# Patient Record
Sex: Female | Born: 1960 | Race: White | Hispanic: No | Marital: Married | Smoking: Current every day smoker
Health system: Southern US, Community
[De-identification: ages and names within clinical notes are randomized; demographics above are authoritative.]

## PROBLEM LIST (undated history)

## (undated) DIAGNOSIS — F3289 Other specified depressive episodes: Secondary | ICD-10-CM

## (undated) DIAGNOSIS — K219 Gastro-esophageal reflux disease without esophagitis: Secondary | ICD-10-CM

## (undated) DIAGNOSIS — F17200 Nicotine dependence, unspecified, uncomplicated: Secondary | ICD-10-CM

## (undated) DIAGNOSIS — E7800 Pure hypercholesterolemia, unspecified: Secondary | ICD-10-CM

## (undated) DIAGNOSIS — K5730 Diverticulosis of large intestine without perforation or abscess without bleeding: Secondary | ICD-10-CM

## (undated) DIAGNOSIS — K648 Other hemorrhoids: Secondary | ICD-10-CM

## (undated) DIAGNOSIS — R209 Unspecified disturbances of skin sensation: Secondary | ICD-10-CM

## (undated) DIAGNOSIS — R569 Unspecified convulsions: Secondary | ICD-10-CM

## (undated) DIAGNOSIS — R7309 Other abnormal glucose: Secondary | ICD-10-CM

## (undated) DIAGNOSIS — IMO0002 Reserved for concepts with insufficient information to code with codable children: Secondary | ICD-10-CM

## (undated) DIAGNOSIS — N319 Neuromuscular dysfunction of bladder, unspecified: Secondary | ICD-10-CM

## (undated) DIAGNOSIS — M797 Fibromyalgia: Secondary | ICD-10-CM

## (undated) DIAGNOSIS — F419 Anxiety disorder, unspecified: Secondary | ICD-10-CM

## (undated) DIAGNOSIS — F1021 Alcohol dependence, in remission: Secondary | ICD-10-CM

## (undated) DIAGNOSIS — J309 Allergic rhinitis, unspecified: Secondary | ICD-10-CM

## (undated) DIAGNOSIS — F329 Major depressive disorder, single episode, unspecified: Secondary | ICD-10-CM

## (undated) DIAGNOSIS — I872 Venous insufficiency (chronic) (peripheral): Secondary | ICD-10-CM

## (undated) DIAGNOSIS — IMO0001 Reserved for inherently not codable concepts without codable children: Secondary | ICD-10-CM

## (undated) DIAGNOSIS — M25519 Pain in unspecified shoulder: Secondary | ICD-10-CM

## (undated) HISTORY — DX: Neuromuscular dysfunction of bladder, unspecified: N31.9

## (undated) HISTORY — DX: Other specified depressive episodes: F32.89

## (undated) HISTORY — DX: Gastro-esophageal reflux disease without esophagitis: K21.9

## (undated) HISTORY — DX: Reserved for inherently not codable concepts without codable children: IMO0001

## (undated) HISTORY — DX: Fibromyalgia: M79.7

## (undated) HISTORY — DX: Other hemorrhoids: K64.8

## (undated) HISTORY — DX: Reserved for concepts with insufficient information to code with codable children: IMO0002

## (undated) HISTORY — DX: Diverticulosis of large intestine without perforation or abscess without bleeding: K57.30

## (undated) HISTORY — DX: Pain in unspecified shoulder: M25.519

## (undated) HISTORY — DX: Pure hypercholesterolemia, unspecified: E78.00

## (undated) HISTORY — DX: Alcohol dependence, in remission: F10.21

## (undated) HISTORY — PX: BREAST ENHANCEMENT SURGERY: SHX7

## (undated) HISTORY — DX: Major depressive disorder, single episode, unspecified: F32.9

## (undated) HISTORY — PX: TEMPOROMANDIBULAR JOINT SURGERY: SHX35

## (undated) HISTORY — DX: Nicotine dependence, unspecified, uncomplicated: F17.200

## (undated) HISTORY — PX: INTERSTIM IMPLANT PLACEMENT: SHX5130

## (undated) HISTORY — DX: Other abnormal glucose: R73.09

## (undated) HISTORY — DX: Allergic rhinitis, unspecified: J30.9

## (undated) HISTORY — DX: Unspecified disturbances of skin sensation: R20.9

## (undated) HISTORY — DX: Unspecified convulsions: R56.9

## (undated) HISTORY — DX: Venous insufficiency (chronic) (peripheral): I87.2

## (undated) HISTORY — DX: Anxiety disorder, unspecified: F41.9

---

## 1994-01-08 HISTORY — PX: VAGINAL HYSTERECTOMY: SUR661

## 2005-04-26 ENCOUNTER — Emergency Department (HOSPITAL_COMMUNITY): Admission: EM | Admit: 2005-04-26 | Discharge: 2005-04-27 | Payer: Self-pay | Admitting: Emergency Medicine

## 2005-05-10 ENCOUNTER — Ambulatory Visit: Payer: Self-pay | Admitting: Unknown Physician Specialty

## 2005-06-08 ENCOUNTER — Ambulatory Visit: Payer: Self-pay | Admitting: Unknown Physician Specialty

## 2006-06-29 ENCOUNTER — Emergency Department: Payer: Self-pay | Admitting: Emergency Medicine

## 2006-10-07 ENCOUNTER — Other Ambulatory Visit: Payer: Self-pay

## 2006-10-07 ENCOUNTER — Inpatient Hospital Stay: Payer: Self-pay | Admitting: Unknown Physician Specialty

## 2006-12-23 ENCOUNTER — Ambulatory Visit: Payer: Self-pay | Admitting: Internal Medicine

## 2007-02-06 ENCOUNTER — Emergency Department: Payer: Self-pay | Admitting: Emergency Medicine

## 2007-07-31 ENCOUNTER — Ambulatory Visit: Payer: Self-pay | Admitting: Internal Medicine

## 2007-08-20 ENCOUNTER — Ambulatory Visit: Payer: Self-pay | Admitting: Orthopedic Surgery

## 2007-08-27 ENCOUNTER — Ambulatory Visit: Payer: Self-pay | Admitting: Orthopedic Surgery

## 2007-11-10 ENCOUNTER — Emergency Department: Payer: Self-pay | Admitting: Emergency Medicine

## 2007-12-03 ENCOUNTER — Ambulatory Visit: Payer: Self-pay | Admitting: Family Medicine

## 2008-01-25 ENCOUNTER — Emergency Department: Payer: Self-pay | Admitting: Emergency Medicine

## 2008-05-14 ENCOUNTER — Emergency Department: Payer: Self-pay | Admitting: Emergency Medicine

## 2008-09-30 ENCOUNTER — Ambulatory Visit: Payer: Self-pay | Admitting: Family Medicine

## 2008-10-20 ENCOUNTER — Ambulatory Visit: Payer: Self-pay | Admitting: Family Medicine

## 2008-10-27 ENCOUNTER — Ambulatory Visit: Payer: Self-pay | Admitting: Otolaryngology

## 2008-10-27 LAB — HM COLONOSCOPY

## 2008-11-04 ENCOUNTER — Ambulatory Visit: Payer: Self-pay | Admitting: Gastroenterology

## 2008-11-04 HISTORY — PX: COLONOSCOPY: SHX174

## 2008-12-09 ENCOUNTER — Emergency Department: Payer: Self-pay | Admitting: Emergency Medicine

## 2008-12-13 ENCOUNTER — Ambulatory Visit: Payer: Self-pay

## 2009-08-03 ENCOUNTER — Ambulatory Visit: Payer: Self-pay | Admitting: Family Medicine

## 2009-08-05 ENCOUNTER — Ambulatory Visit: Payer: Self-pay | Admitting: Family Medicine

## 2009-08-26 ENCOUNTER — Emergency Department (HOSPITAL_COMMUNITY): Admission: EM | Admit: 2009-08-26 | Discharge: 2009-08-26 | Payer: Self-pay | Admitting: Emergency Medicine

## 2009-10-04 ENCOUNTER — Ambulatory Visit: Payer: Self-pay | Admitting: Family Medicine

## 2009-10-31 ENCOUNTER — Ambulatory Visit: Payer: Self-pay | Admitting: Family Medicine

## 2009-11-03 ENCOUNTER — Ambulatory Visit: Payer: Self-pay | Admitting: Family Medicine

## 2009-11-04 ENCOUNTER — Ambulatory Visit: Payer: Self-pay | Admitting: Family Medicine

## 2009-11-09 ENCOUNTER — Ambulatory Visit: Payer: Self-pay | Admitting: Family Medicine

## 2010-03-08 ENCOUNTER — Emergency Department: Payer: Self-pay | Admitting: Internal Medicine

## 2010-03-24 LAB — URINALYSIS, ROUTINE W REFLEX MICROSCOPIC
Hgb urine dipstick: NEGATIVE
Nitrite: NEGATIVE
Protein, ur: NEGATIVE mg/dL
Specific Gravity, Urine: 1.012 (ref 1.005–1.030)
Urobilinogen, UA: 0.2 mg/dL (ref 0.0–1.0)

## 2010-03-24 LAB — URINE CULTURE: Colony Count: 30000

## 2010-03-24 LAB — URINE MICROSCOPIC-ADD ON

## 2010-05-31 ENCOUNTER — Ambulatory Visit: Payer: Self-pay | Admitting: Family Medicine

## 2010-07-08 ENCOUNTER — Emergency Department: Payer: Self-pay | Admitting: Emergency Medicine

## 2010-07-28 ENCOUNTER — Ambulatory Visit: Payer: Self-pay | Admitting: Orthopedic Surgery

## 2010-09-13 ENCOUNTER — Ambulatory Visit: Payer: Self-pay | Admitting: Family Medicine

## 2010-11-03 ENCOUNTER — Ambulatory Visit: Payer: Self-pay | Admitting: Family Medicine

## 2010-12-12 ENCOUNTER — Ambulatory Visit: Payer: Self-pay | Admitting: Family Medicine

## 2011-01-14 LAB — BASIC METABOLIC PANEL
Anion Gap: 6 — ABNORMAL LOW (ref 7–16)
BUN: 6 mg/dL — ABNORMAL LOW (ref 7–18)
Calcium, Total: 9.1 mg/dL (ref 8.5–10.1)
Chloride: 105 mmol/L (ref 98–107)
EGFR (African American): >60
EGFR (Non-African Amer.): >60
Glucose: 92 mg/dL (ref 65–99)
Osmolality: 279 (ref 275–301)
Potassium: 3.6 mmol/L (ref 3.5–5.1)
Sodium: 141 mmol/L (ref 136–145)

## 2011-01-14 LAB — CK TOTAL AND CKMB (NOT AT ARMC): CK-MB: <0.5 ng/mL — ABNORMAL LOW (ref 0.5–3.6)

## 2011-01-14 LAB — CBC
HCT: 37.3 % (ref 35.0–47.0)
MCHC: 33.4 g/dL (ref 32.0–36.0)
RDW: 13 % (ref 11.5–14.5)
WBC: 6.5 10*3/uL (ref 3.6–11.0)

## 2011-01-14 LAB — HEPATIC FUNCTION PANEL A (ARMC)
Albumin: 4 g/dL (ref 3.4–5.0)
Bilirubin, Direct: <0.1 mg/dL (ref 0.00–0.20)
SGOT(AST): 29 U/L (ref 15–37)
SGPT (ALT): 30 U/L
Total Protein: 7 g/dL (ref 6.4–8.2)

## 2011-01-15 ENCOUNTER — Observation Stay: Payer: Self-pay | Admitting: Internal Medicine

## 2011-01-15 LAB — PROTIME-INR: INR: 1

## 2011-01-15 LAB — LIPID PANEL
Cholesterol: 145 mg/dL (ref 0–200)
Ldl Cholesterol, Calc: 68 mg/dL (ref 0–100)

## 2011-01-15 LAB — HEMOGLOBIN A1C: Hemoglobin A1C: 6 % (ref 4.2–6.3)

## 2011-01-15 LAB — CBC WITH DIFFERENTIAL/PLATELET
Basophil #: 0 10*3/uL (ref 0.0–0.1)
Basophil %: 0.2 %
Eosinophil #: 0.1 10*3/uL (ref 0.0–0.7)
HCT: 37.1 % (ref 35.0–47.0)
MCH: 31 pg (ref 26.0–34.0)
MCV: 91 fL (ref 80–100)
Monocyte #: 0.4 10*3/uL (ref 0.0–0.7)
Neutrophil #: 2.5 10*3/uL (ref 1.4–6.5)
Platelet: 208 10*3/uL (ref 150–440)
RDW: 13 % (ref 11.5–14.5)
WBC: 6.2 10*3/uL (ref 3.6–11.0)

## 2011-01-15 LAB — CK TOTAL AND CKMB (NOT AT ARMC)
CK, Total: 71 U/L (ref 21–215)
CK, Total: 68 U/L (ref 21–215)
CK-MB: <0.5 ng/mL — ABNORMAL LOW (ref 0.5–3.6)
CK-MB: 0.5 ng/mL (ref 0.5–3.6)

## 2011-01-15 LAB — BASIC METABOLIC PANEL
Anion Gap: 8 (ref 7–16)
BUN: 6 mg/dL — ABNORMAL LOW (ref 7–18)
EGFR (African American): >60
EGFR (Non-African Amer.): >60
Glucose: 95 mg/dL (ref 65–99)
Osmolality: 286 (ref 275–301)

## 2011-01-15 LAB — TSH: Thyroid Stimulating Horm: 2.11 u[IU]/mL

## 2011-01-15 LAB — TROPONIN I: Troponin-I: <0.02 ng/mL

## 2011-02-01 ENCOUNTER — Ambulatory Visit: Payer: Self-pay | Admitting: Family Medicine

## 2011-02-23 ENCOUNTER — Ambulatory Visit: Payer: Self-pay | Admitting: Family Medicine

## 2011-02-25 IMAGING — CT CT NECK-CHEST W/ CM
2 of 3 series · 7 of 14 positions shown, 8 images · IV contrast (APPLIED)
Comparison: none

REASON FOR EXAM: chest pain  SOB nausea palpitations dysphagia LED  CHEST
w PE protocol
COMMENTS:

PROCEDURE:     NIVIRUS - NIVIRUS NECK AND CHEST W  - September 30, 2008  [DATE]
RESULT:     CT neck and chest dated 09/30/2008
TECHNIQUE: Helical 3 mm sections were obtained from the skull base through
the thoracic inlet status post intravenous administration of 100 mL Isovue
370. Helical 3 mm sections were also obtained from the thoracic inlet
through the lung bases status post administration of 100 mL of Dsovue-B84
for a pulmonary embolus protocol. The initial injection demonstrated
suboptimal opacification of the pulmonary arterial system. A repeat
injection was performed status post intravenous administration of 75 mL
Isovue 370. The second injection also demonstrated suboptimal opacification
of the pulmonary vessels. If there is persistent clinical concern a repeat
PE protocol chest CT can't be performed.

[Series 7: soft tissue · axial · 0.78mm/px · z∈[+389,+686]mm · 5 of 146 slices shown]
[im 25/146  soft-tissue]
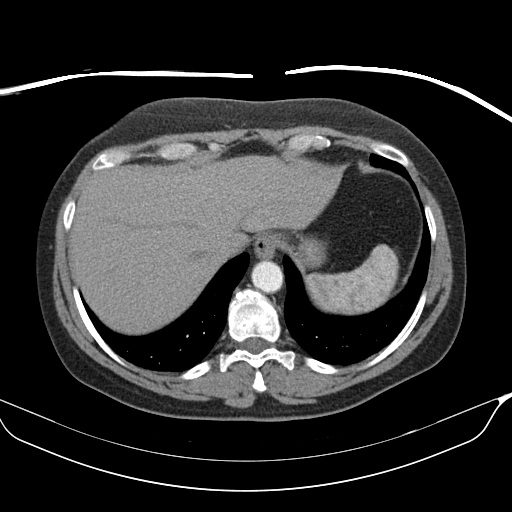
[im 49/146  soft-tissue]
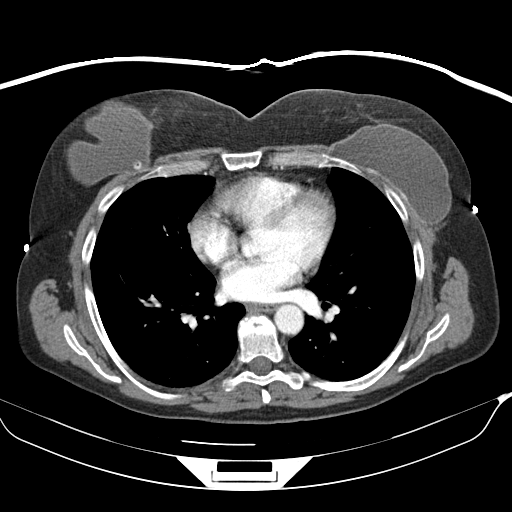
[im 73/146  soft-tissue]
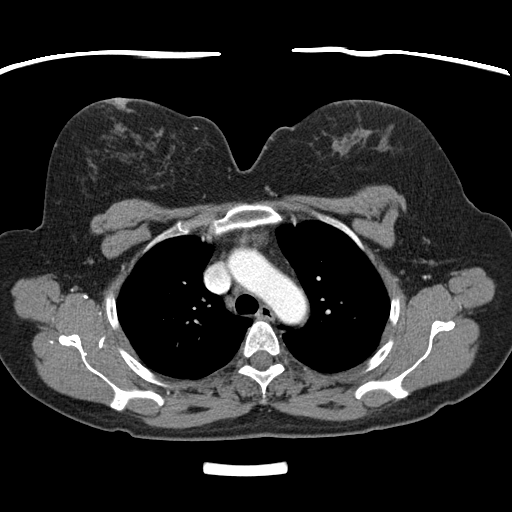
[im 97/146  soft-tissue]
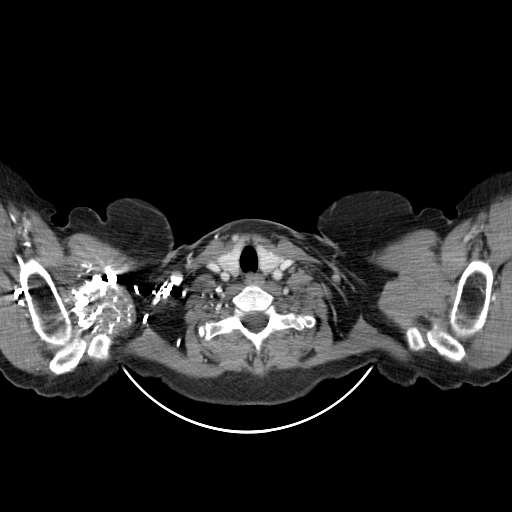
[im 121/146  soft-tissue]
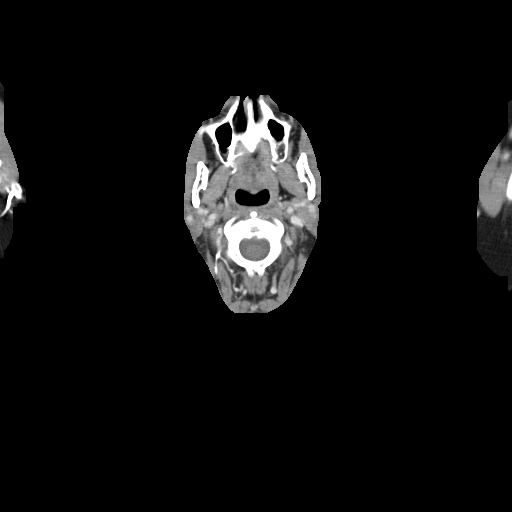

[Series 14: pulmonary repeat · axial · 0.78mm/px · z∈[+415,+484]mm · 2 of 70 slices shown, 3 images]
[im 24/70  soft-tissue]
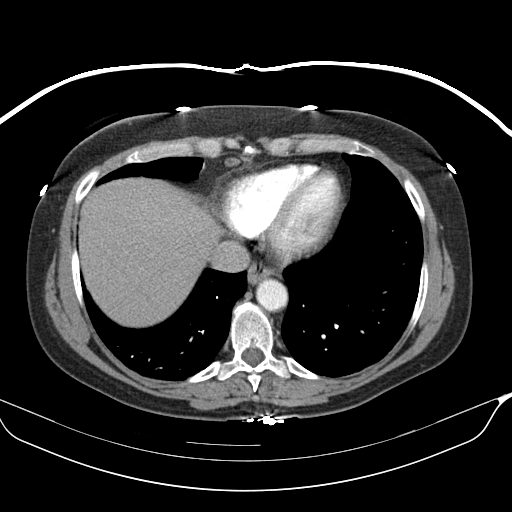
[im 24/70  bone]
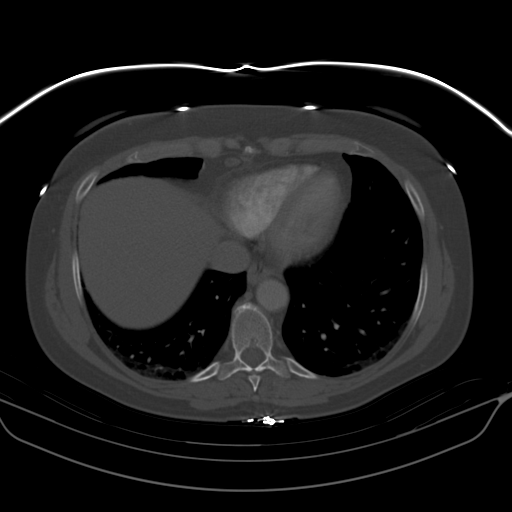
[im 47/70  bone]
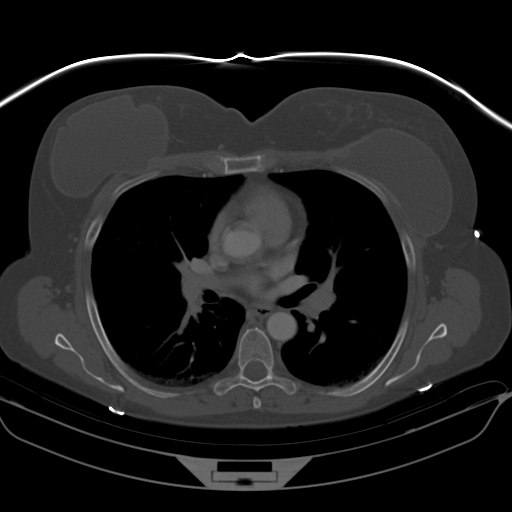

[7 of 14 positions shown; findings below may reference images not displayed]

FINDINGS: Neck: Evaluation of the neck demonstrates no evidence of masses free fluid
nor drainable loculated fluid collections. The opacified vascular structures
are unremarkable. Prominent lymph nodes are identified in the anterior and
posterior cervical chains. The largest lymph nodes project within the pre-
carotid space the largest is on the right on image #35 in the precarinal
space measuring 1.51 cm in AP dimensions. A 1.13 cm lymph node in the
carotid space on the left is identified. The remaining lymph nodes appear to
be subcentimeter in size. The patient's airway is patent. There is no CT
evidence of muscle infiltration, asymmetry or abnormal enhancement.

Chest: Evaluation of the mediastinum and hilar regions and structures
demonstrate no evidence of mediastinal nor hilar adenopathy nor masses. As
stated above there is suboptimal opacification of the pulmonary arterial
tree. There is no gross evidence of large central pulmonary embolic disease.
The lobar segmental and subsegmental arteries are suboptimally pulmonary
arteries are suboptimally opacified opacified. No gross pulmonary arterial
disease is identified within the lobar pulmonary arterial vessels.
Evaluation of the lung parenchyma demonstrate no evidence of focal
infiltrates effusions or edema. This hypoventilation within the lung bases.
The visualized upper abdominal viscera demonstrate no gross abnormalities.
IMPRESSION: 1. Prominent lymph nodes within the neck as described above further
abnormalities.
2. No evidence of pulmonary infiltrates, effusions, edema, masses, nor
nodules. There is no evidence of mediastinal masses or adenopathy.
Evaluation of the pulmonary arterial system is suboptimal as described above
and no gross evidence of large pulmonary emboli is identified.

## 2011-05-01 LAB — HM PAP SMEAR: HM Pap smear: NORMAL

## 2011-06-07 ENCOUNTER — Emergency Department: Payer: Self-pay | Admitting: Emergency Medicine

## 2011-06-26 ENCOUNTER — Ambulatory Visit: Payer: Self-pay | Admitting: Family Medicine

## 2011-07-16 DIAGNOSIS — M7502 Adhesive capsulitis of left shoulder: Secondary | ICD-10-CM | POA: Insufficient documentation

## 2011-07-27 DIAGNOSIS — M25519 Pain in unspecified shoulder: Secondary | ICD-10-CM | POA: Insufficient documentation

## 2011-10-02 ENCOUNTER — Encounter: Payer: Self-pay | Admitting: Family Medicine

## 2011-10-04 ENCOUNTER — Encounter: Payer: Self-pay | Admitting: *Deleted

## 2011-10-29 ENCOUNTER — Ambulatory Visit (INDEPENDENT_AMBULATORY_CARE_PROVIDER_SITE_OTHER): Payer: Managed Care, Other (non HMO) | Admitting: Family Medicine

## 2011-10-29 ENCOUNTER — Encounter: Payer: Self-pay | Admitting: Family Medicine

## 2011-10-29 VITALS — BP 127/81 | HR 74 | Temp 98.2°F | Resp 16 | Ht 67.0 in | Wt 187.0 lb

## 2011-10-29 DIAGNOSIS — E7800 Pure hypercholesterolemia, unspecified: Secondary | ICD-10-CM

## 2011-10-29 DIAGNOSIS — R7309 Other abnormal glucose: Secondary | ICD-10-CM

## 2011-10-29 DIAGNOSIS — M6281 Muscle weakness (generalized): Secondary | ICD-10-CM

## 2011-10-29 DIAGNOSIS — F418 Other specified anxiety disorders: Secondary | ICD-10-CM

## 2011-10-29 DIAGNOSIS — F329 Major depressive disorder, single episode, unspecified: Secondary | ICD-10-CM

## 2011-10-29 DIAGNOSIS — J309 Allergic rhinitis, unspecified: Secondary | ICD-10-CM

## 2011-10-29 DIAGNOSIS — R339 Retention of urine, unspecified: Secondary | ICD-10-CM | POA: Insufficient documentation

## 2011-10-29 MED ORDER — ESCITALOPRAM OXALATE 10 MG PO TABS: 10.0000 mg | ORAL_TABLET | Freq: Every day | ORAL | Status: DC

## 2011-10-29 MED ORDER — FLUTICASONE PROPIONATE 50 MCG/ACT NA SUSP: 2.0000 | Freq: Every day | NASAL | Status: DC

## 2011-10-29 NOTE — Assessment & Plan Note (Signed)
Improved s/p bladder stimulator; no longer warranting self catheterization.

## 2011-10-29 NOTE — Assessment & Plan Note (Signed)
Stable; weight loss since last visit; obtain labs.  Continue with weight loss, dietary modification.
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
## 2011-10-29 NOTE — Assessment & Plan Note (Signed)
Worsening/uncontrolled. Wean Zoloft over next month; start Lexapro 10mg  daily.  Counseling provided during visit; close follow-up in two months and sooner if worsens.

## 2011-10-29 NOTE — Assessment & Plan Note (Signed)
Uncontrolled; rx for Flonase provided; to continue Allegra daily.

## 2011-10-29 NOTE — Assessment & Plan Note (Signed)
Stable; followed every six months by Dr. Sandria Manly.

## 2011-10-29 NOTE — Assessment & Plan Note (Signed)
Controlled; no change in management; obtain labs. 

## 2011-10-29 NOTE — Patient Instructions (Addendum)
1. Depression with anxiety   2. Allergic rhinitis, cause unspecified   3. Pure hypercholesterolemia   4. Other abnormal glucose   5. Muscle weakness      1.  TAKE 1.5 ZOLOFT DAILY FOR ONE WEEK. 2. TAKE 1 TABLET ZOLOFT DAILY WITH 1 TABLET LEXAPRO DAILY FOR TWO WEEKS. 3.  TAKE 1/2 TABLET ZOLOFT DAILY WITH 1 TABLET LEXAPRO FOR ONE WEEK 4.  STOP ZOLOFT; CONTINUE LEXAPRO 1 TABLET DAILY

## 2011-10-30 LAB — CBC WITH DIFFERENTIAL/PLATELET
Basophils Absolute: 0 10*3/uL (ref 0.0–0.1)
Basophils Relative: 0 % (ref 0–1)
Eosinophils Absolute: 0.1 10*3/uL (ref 0.0–0.7)
HCT: 40.6 % (ref 36.0–46.0)
Hemoglobin: 14 g/dL (ref 12.0–15.0)
MCH: 31 pg (ref 26.0–34.0)
MCHC: 34.5 g/dL (ref 30.0–36.0)
Monocytes Absolute: 0.7 10*3/uL (ref 0.1–1.0)
Monocytes Relative: 7 % (ref 3–12)
Neutro Abs: 6.5 10*3/uL (ref 1.7–7.7)
RDW: 13.6 % (ref 11.5–15.5)

## 2011-10-30 LAB — LIPID PANEL
Cholesterol: 179 mg/dL (ref 0–200)
HDL: 49 mg/dL (ref >39)
Total CHOL/HDL Ratio: 3.7 Ratio
Triglycerides: 154 mg/dL — ABNORMAL HIGH (ref <150)

## 2011-10-30 LAB — COMPREHENSIVE METABOLIC PANEL
AST: 23 U/L (ref 0–37)
Albumin: 4.8 g/dL (ref 3.5–5.2)
BUN: 10 mg/dL (ref 6–23)
CO2: 32 mEq/L (ref 19–32)
Calcium: 9.9 mg/dL (ref 8.4–10.5)
Chloride: 104 mEq/L (ref 96–112)
Creat: 0.77 mg/dL (ref 0.50–1.10)
Glucose, Bld: 72 mg/dL (ref 70–99)
Potassium: 4.4 mEq/L (ref 3.5–5.3)

## 2011-11-03 NOTE — Progress Notes (Signed)
Reviewed and agree.

## 2011-11-14 ENCOUNTER — Encounter: Payer: Self-pay | Admitting: Family Medicine

## 2011-11-21 ENCOUNTER — Other Ambulatory Visit: Payer: Self-pay | Admitting: Family Medicine

## 2011-11-22 ENCOUNTER — Telehealth: Payer: Self-pay

## 2011-11-22 NOTE — Telephone Encounter (Signed)
Patient states that the pharmacy sent in a refill request for her Ambien.  Patient is wanting to get a refill sent in for [facility] to [facility] [place], [place].   Best#: [phone]  Pharmacy: [facility] [place], [place]

## 2011-11-22 NOTE — Telephone Encounter (Signed)
Called patient advised this is sent in for her.

## 2011-12-20 ENCOUNTER — Other Ambulatory Visit: Payer: Self-pay | Admitting: Physician Assistant

## 2011-12-21 ENCOUNTER — Telehealth: Payer: Self-pay

## 2011-12-21 NOTE — Telephone Encounter (Signed)
OK to call in refill of Ambien CR 12.5mg  one po qhs PRN #[phone] refills. KMS

## 2011-12-21 NOTE — Telephone Encounter (Signed)
The patient called to request refill for her Ambien rx.  The patient stated that she called the pharmacy for refill and there were no remaining refills, which is not typical for her rx.  The patient stated that Dr. Katrinka Blazing usually writes the rx with refills.  Please call the patient at [phone].  [facility] [place] [facility].

## 2011-12-21 NOTE — Telephone Encounter (Signed)
lmom that rx called in and placed on hold.

## 2011-12-21 NOTE — Telephone Encounter (Signed)
Please advise, patient has been taking 12.5mg  but new recommendations are for female patients to take 6.25 mg dose. Patient is asking for refills.

## 2011-12-29 IMAGING — US ABDOMEN ULTRASOUND LIMITED
1 series · 17 of 25 positions shown · non-contrast
Comparison: none

REASON FOR EXAM: RUQ Abd Pain
COMMENTS:

[Series 1: abdomen ultrasound limited · 17 of 63 slices shown]
[im 1/63]
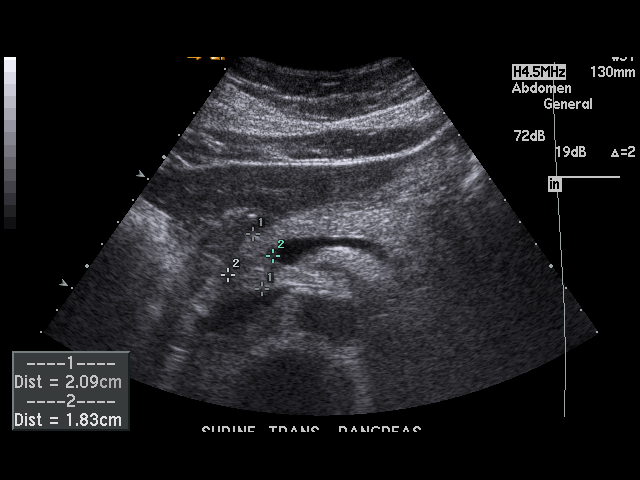
[im 6/63]
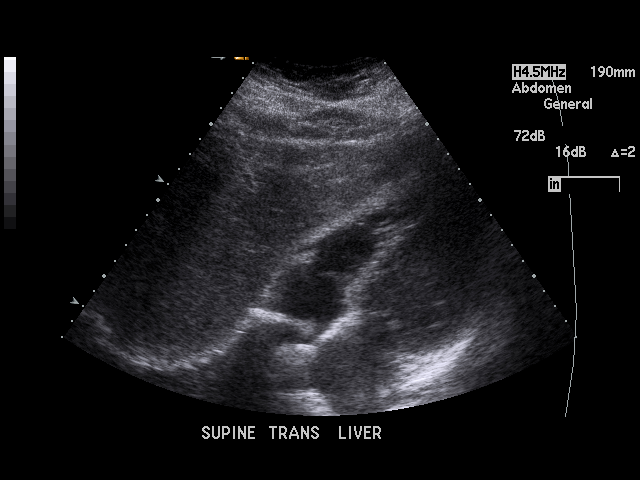
[im 8/63]
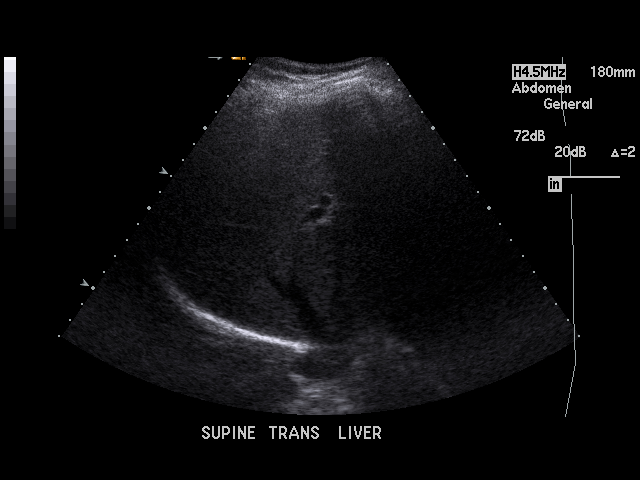
[im 13/63]
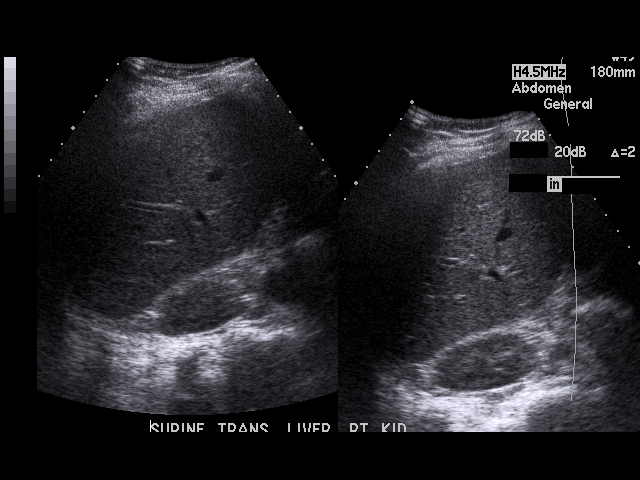
[im 16/63]
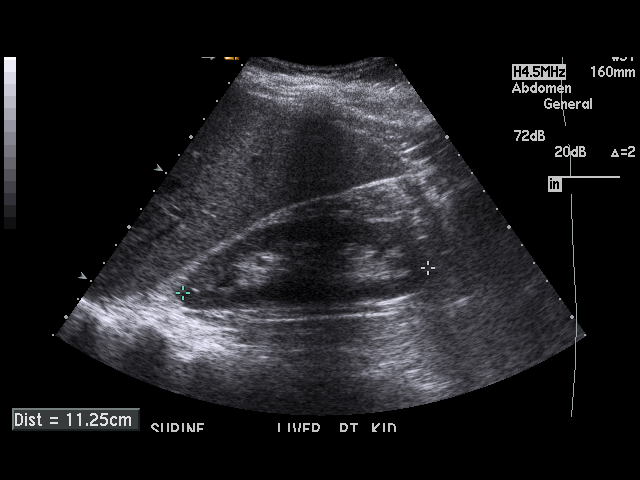
[im 21/63]
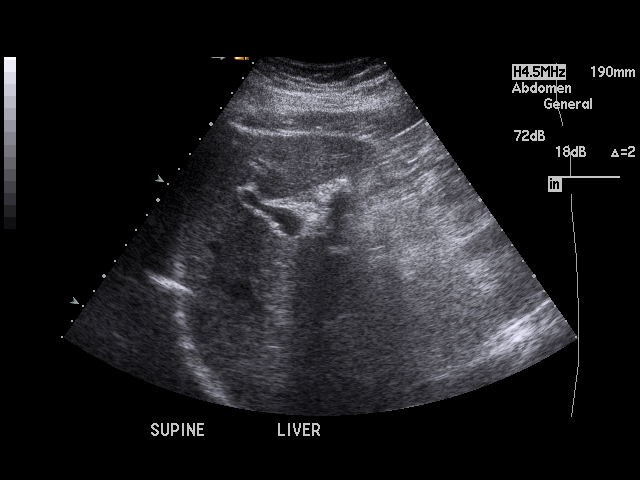
[im 24/63]
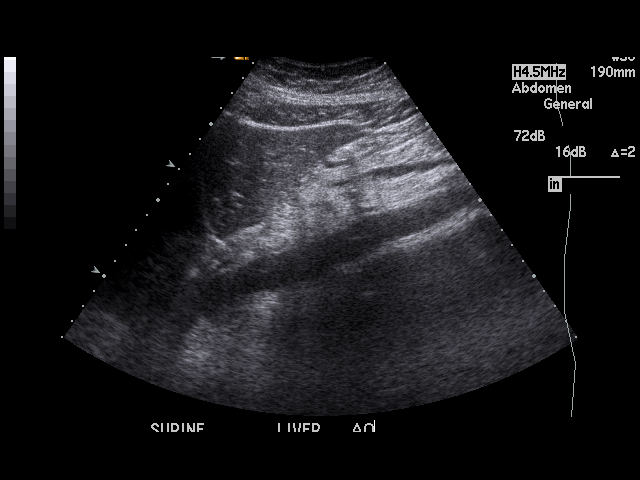
[im 29/63]
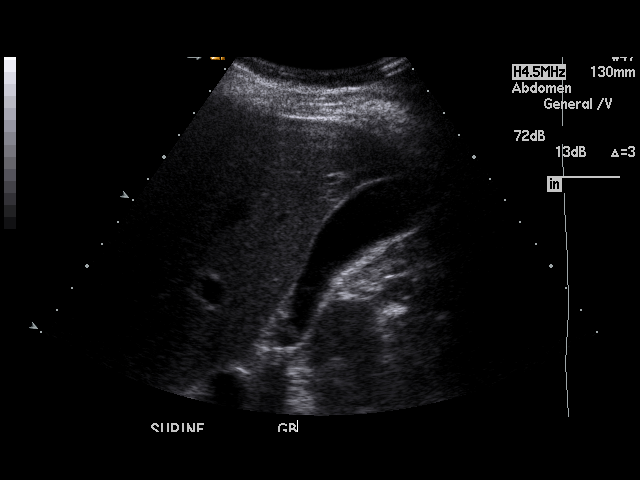
[im 32/63]
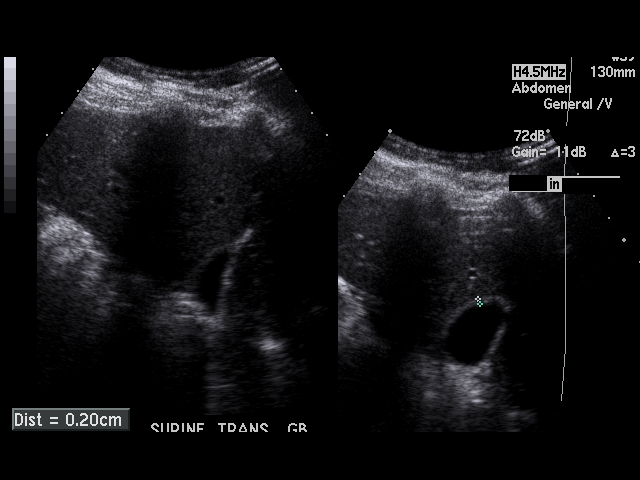
[im 34/63]
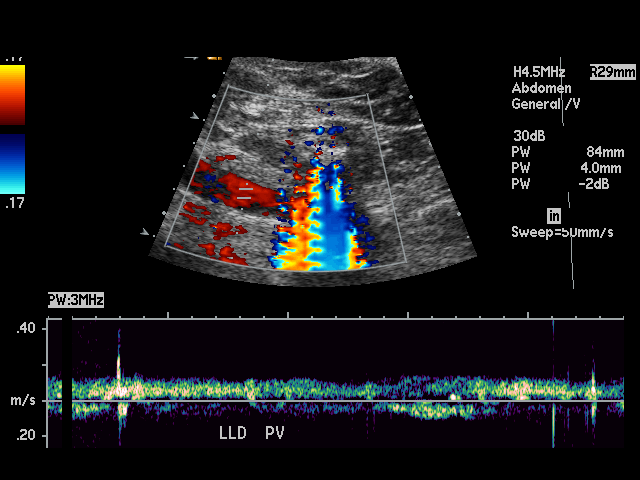
[im 39/63]
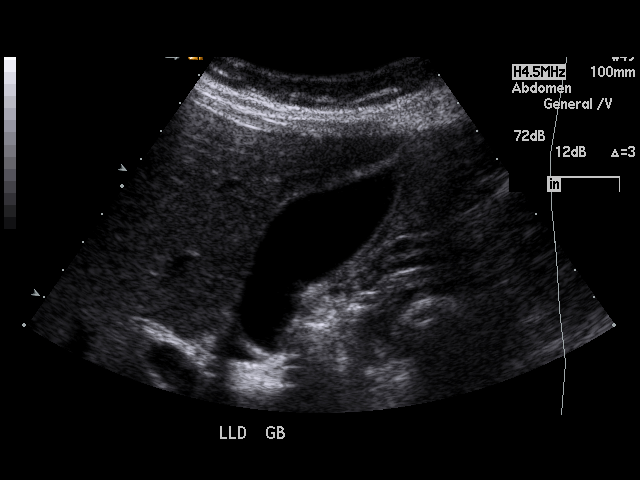
[im 42/63]
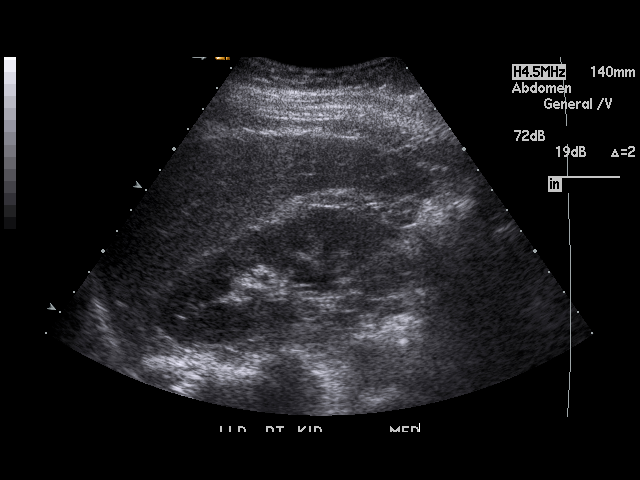
[im 47/63]
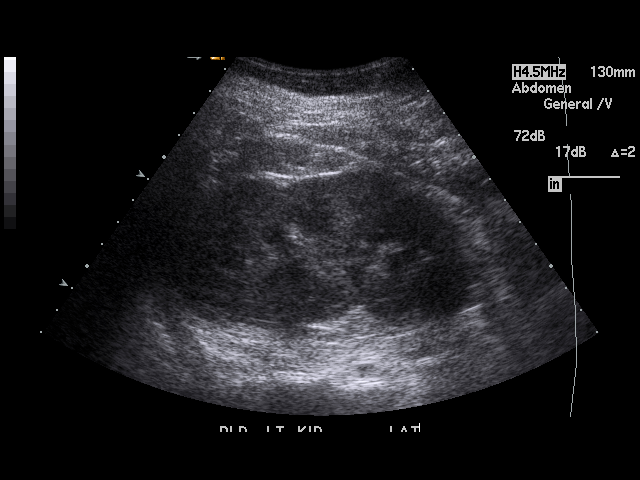
[im 50/63]
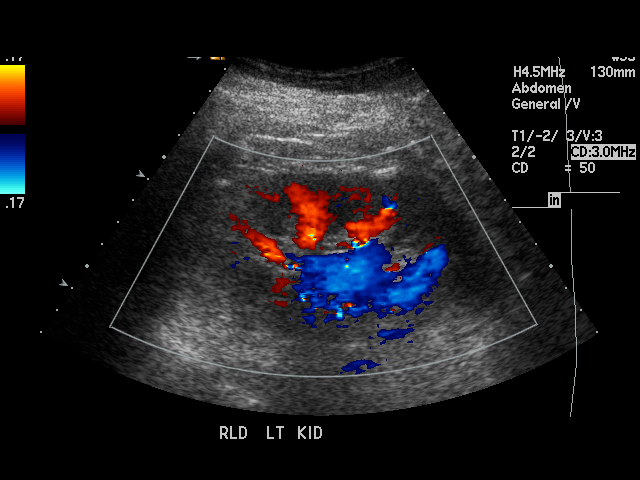
[im 55/63]
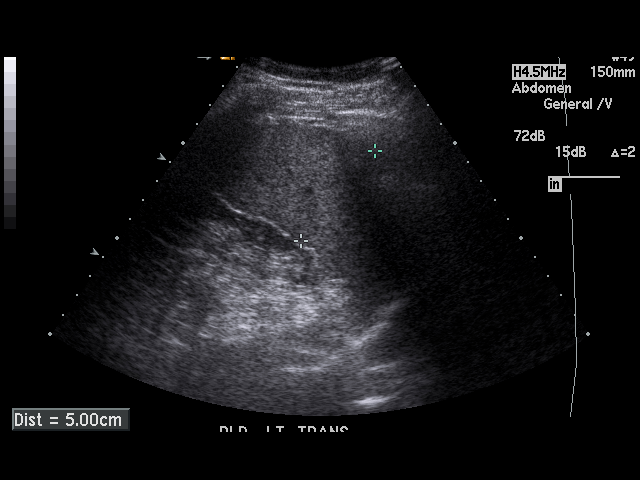
[im 57/63]
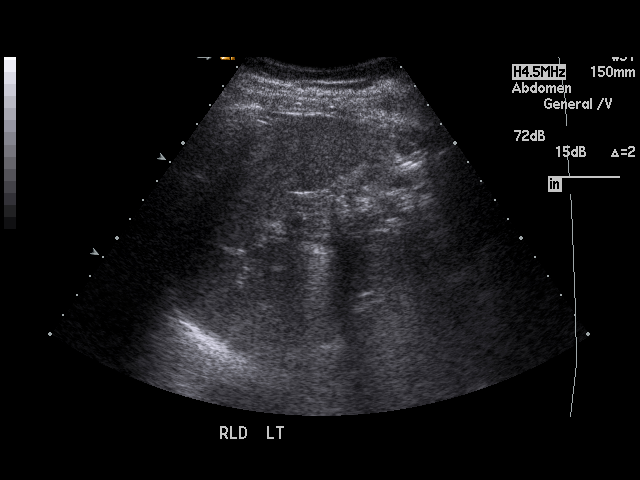
[im 63/63]
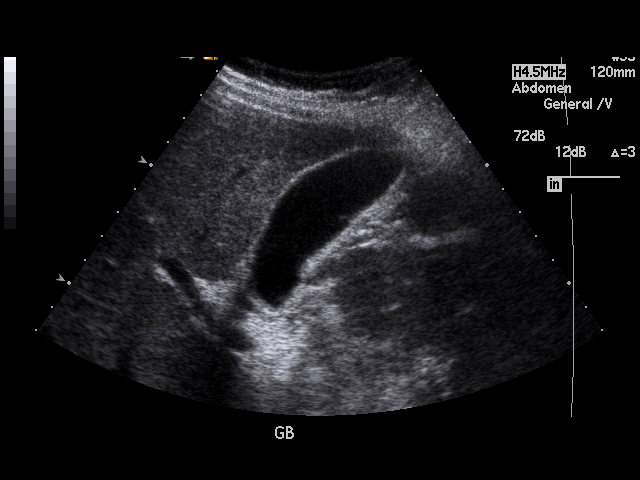

[17 of 25 positions shown; findings below may reference images not displayed]

PROCEDURE:     NEICHEV - NEICHEV ABDOMEN UPPER GENERAL  - August 03, 2009  [DATE]

RESULT:     Ultrasound of the abdomen is performed in the standard fashion.
Images of the pancreas show no gross abnormality. The tail is poorly seen.
The hepatic echotexture is normal. There is no focal hepatic mass or ductal
dilation. The right kidney is normal. The aorta and inferior vena cava
appear unremarkable. No gallstones are evident. The gallbladder wall
thickness is 2.0 mm. The portal venous flow is normal. The common bile duct
diameter is measured at 3.0 to 3.9 mm. The left kidney and spleen appear
normal.
IMPRESSION: Unremarkable abdominal sonogram.

## 2011-12-31 IMAGING — CT CT ABDOMEN W/ CM
1 of 2 series · 15 of 32 positions shown, 19 images · non-contrast
Comparison: none

REASON FOR EXAM: RUQ Abd Pain
COMMENTS:

PROCEDURE:     CT  - CT ABDOMEN STANDARD W  - August 05, 2009  [DATE]
RESULT:     Comparison is made to a prior study dated 11/11/2007.
TECHNIQUE: Helical 5 mm sections were obtained from the lung bases through
the superior iliac crest status post intravenous administration of 85 ml of
3sovue-A0B and oral contrast.

[Series 2: soft tissue · axial · 0.74mm/px · z∈[+259,+549]mm · 15 of 64 slices shown, 19 images]
[im 3/64  soft-tissue]
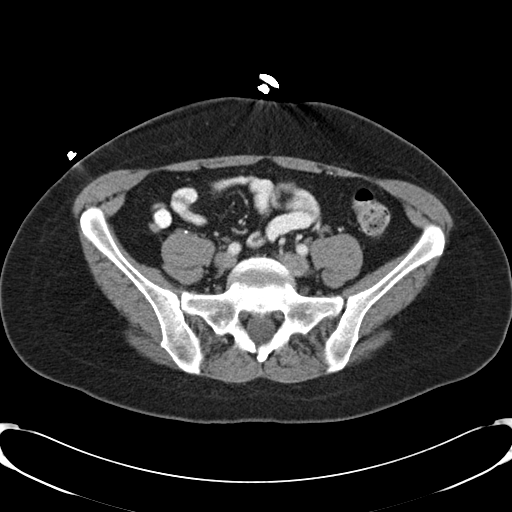
[im 3/64  bone]
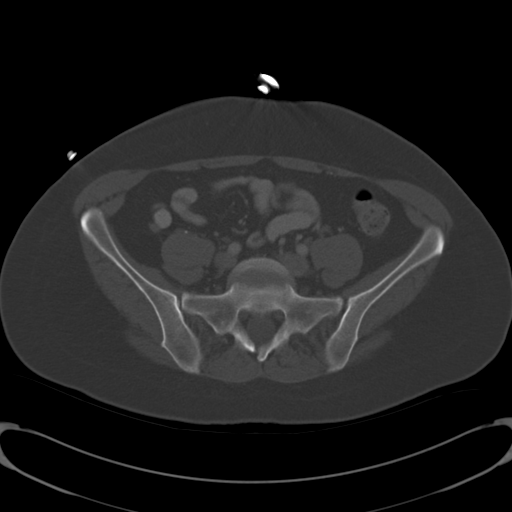
[im 9/64  soft-tissue]
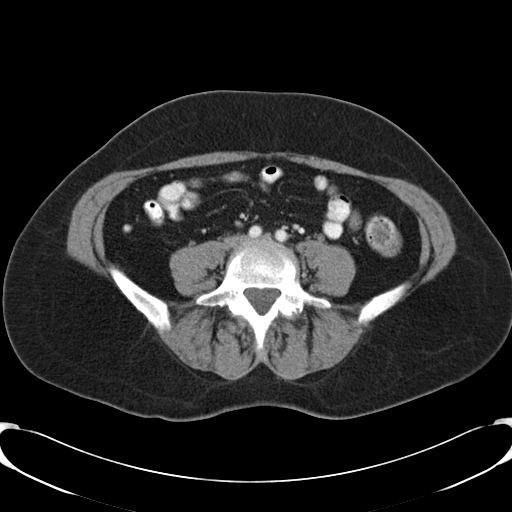
[im 14/64  soft-tissue]
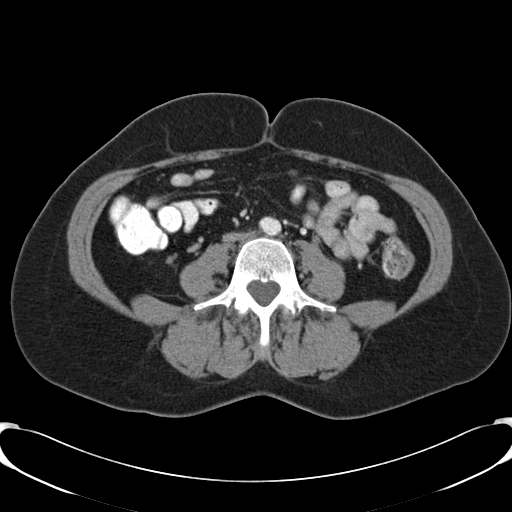
[im 17/64  soft-tissue]
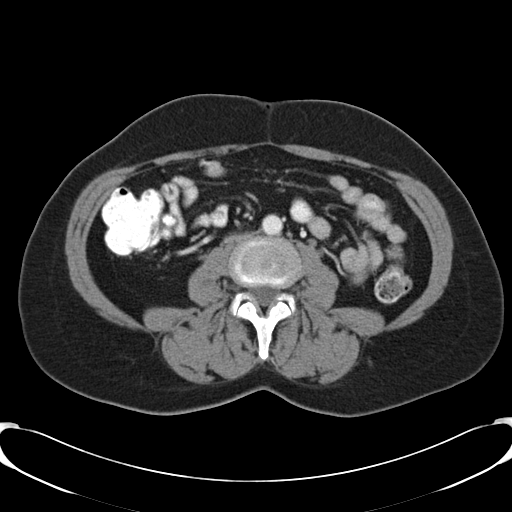
[im 22/64  soft-tissue]
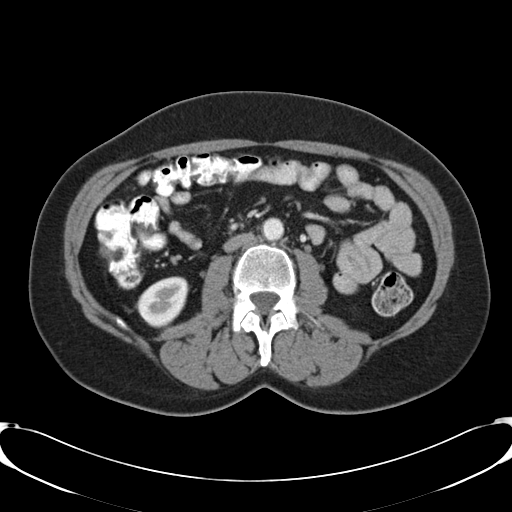
[im 28/64  soft-tissue]
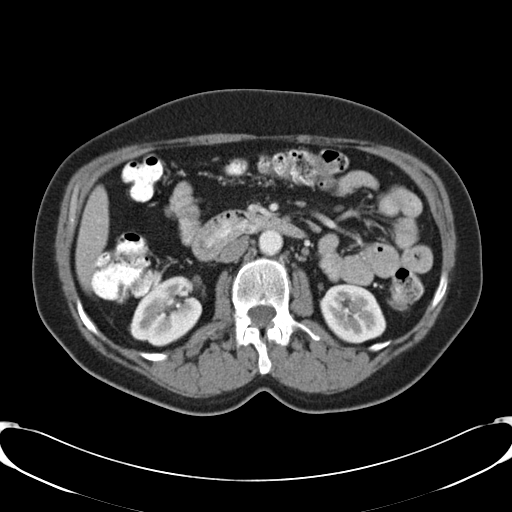
[im 33/64  soft-tissue]
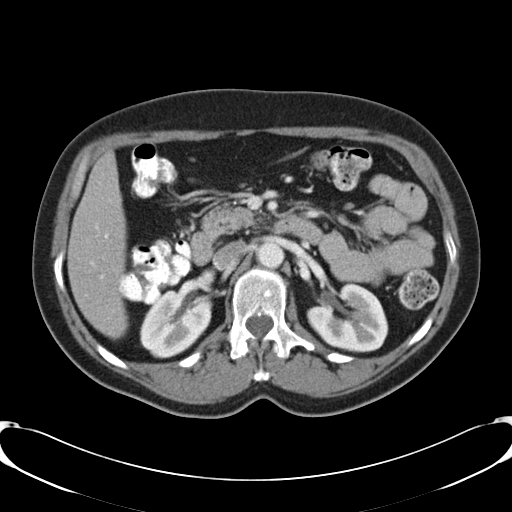
[im 36/64  soft-tissue]
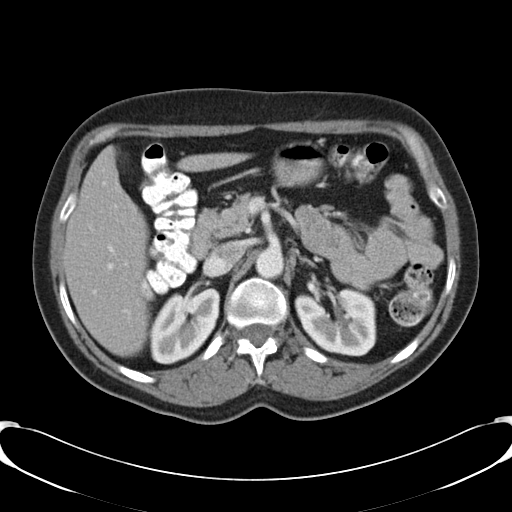
[im 42/64  soft-tissue]
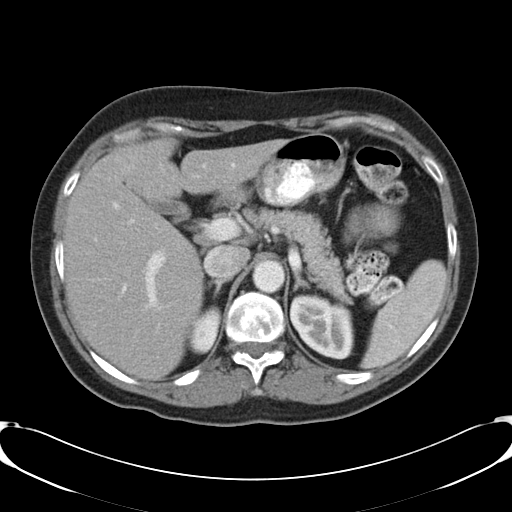
[im 42/64  bone]
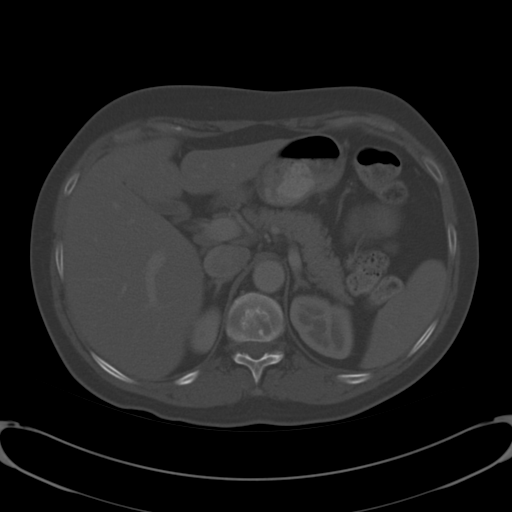
[im 47/64  soft-tissue]
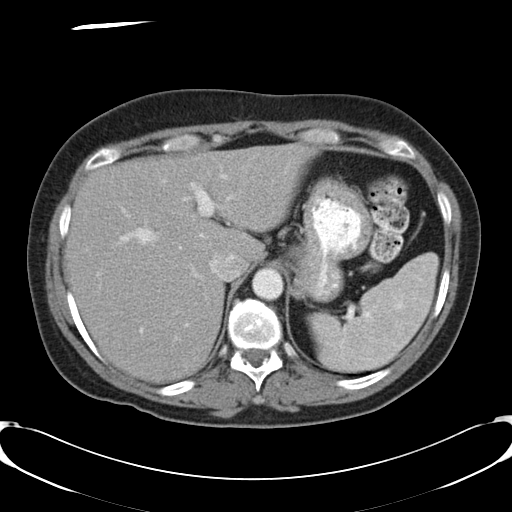
[im 50/64  soft-tissue]
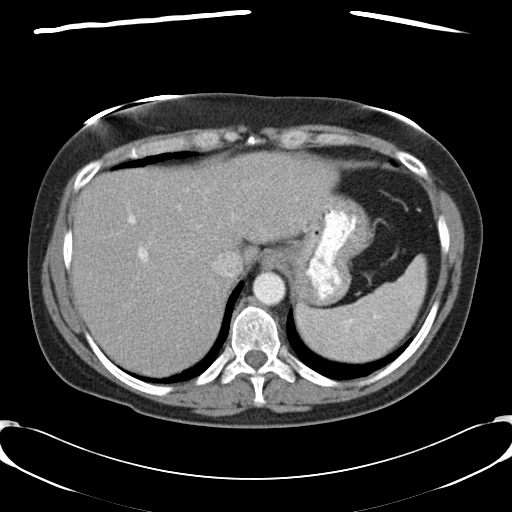
[im 53/64  lung]
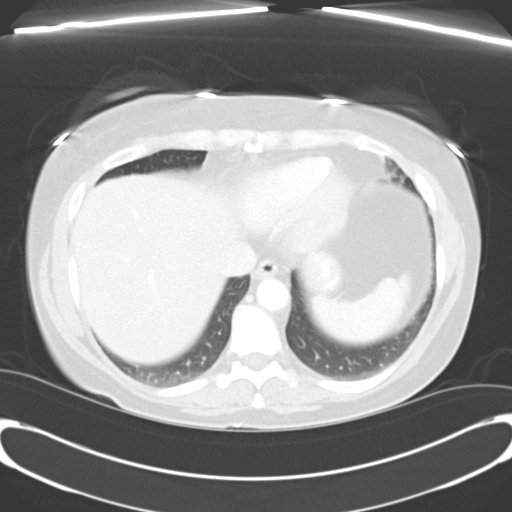
[im 55/64  soft-tissue]
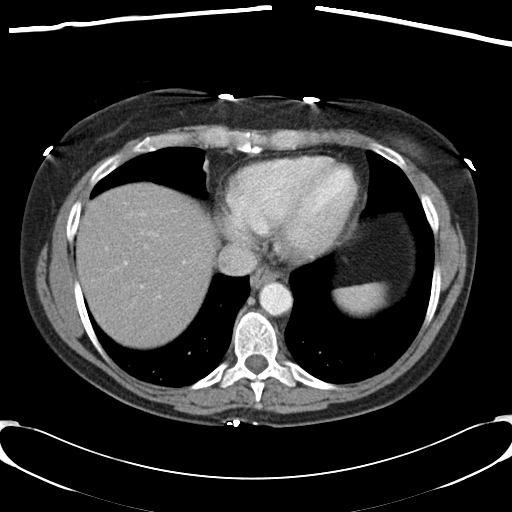
[im 55/64  lung]
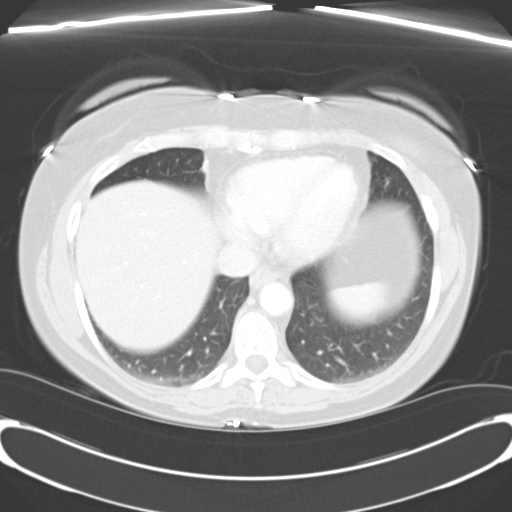
[im 58/64  lung]
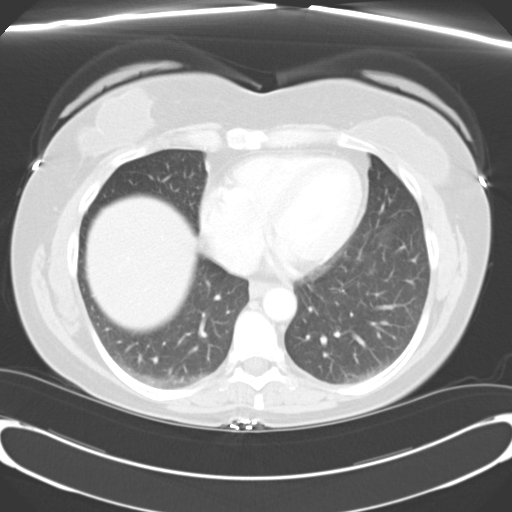
[im 61/64  soft-tissue]
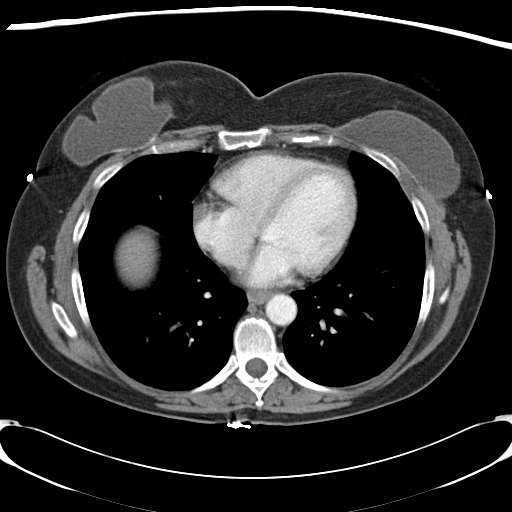
[im 61/64  lung]
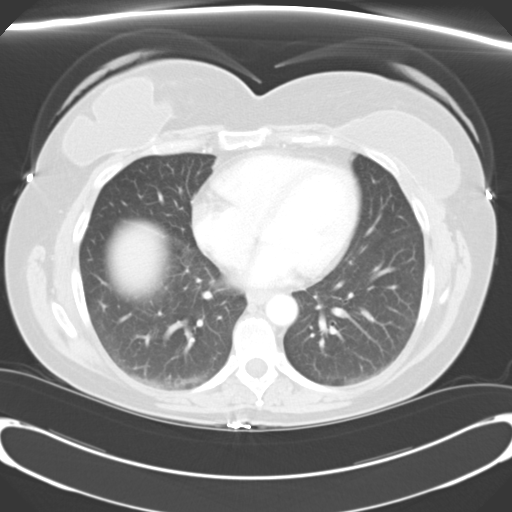

[15 of 32 positions shown; findings below may reference images not displayed]

FINDINGS: The liver, spleen, adrenals, pancreas, and kidneys are
unremarkable. There is no evidence of abdominal free fluid, loculated fluid
collections, masses nor adenopathy. There is no CT evidence reflecting the
sequela of enteritis, colitis, diverticulitis nor appendicitis. The appendix
is identified. There is no evidence of an abdominal aortic aneurysm nor
dissection. The celiac, SMA, IMA, portal vein, and SMV are opacified.
IMPRESSION: 1. No CT evidence of obstructive or inflammatory abnormalities within the
abdomen.
2. Incidental note is made of bilateral breast implants.

## 2012-01-10 DIAGNOSIS — R339 Retention of urine, unspecified: Secondary | ICD-10-CM | POA: Insufficient documentation

## 2012-01-28 ENCOUNTER — Ambulatory Visit: Payer: Managed Care, Other (non HMO) | Admitting: Family Medicine

## 2012-02-25 ENCOUNTER — Encounter: Payer: Self-pay | Admitting: Family Medicine

## 2012-02-25 ENCOUNTER — Ambulatory Visit (INDEPENDENT_AMBULATORY_CARE_PROVIDER_SITE_OTHER): Payer: Managed Care, Other (non HMO) | Admitting: Family Medicine

## 2012-02-25 VITALS — BP 108/70 | HR 64 | Temp 97.8°F | Resp 16 | Ht 67.0 in | Wt 187.0 lb

## 2012-02-25 DIAGNOSIS — R339 Retention of urine, unspecified: Secondary | ICD-10-CM

## 2012-02-25 DIAGNOSIS — E7800 Pure hypercholesterolemia, unspecified: Secondary | ICD-10-CM

## 2012-02-25 DIAGNOSIS — M6281 Muscle weakness (generalized): Secondary | ICD-10-CM

## 2012-02-25 DIAGNOSIS — F341 Dysthymic disorder: Secondary | ICD-10-CM

## 2012-02-25 DIAGNOSIS — F418 Other specified anxiety disorders: Secondary | ICD-10-CM

## 2012-02-25 DIAGNOSIS — R7309 Other abnormal glucose: Secondary | ICD-10-CM

## 2012-02-25 DIAGNOSIS — R7302 Impaired glucose tolerance (oral): Secondary | ICD-10-CM

## 2012-02-25 LAB — CBC WITH DIFFERENTIAL/PLATELET
Basophils Absolute: 0 10*3/uL (ref 0.0–0.1)
Eosinophils Absolute: 0.1 10*3/uL (ref 0.0–0.7)
Eosinophils Relative: 2 % (ref 0–5)
HCT: 38 % (ref 36.0–46.0)
Lymphocytes Relative: 36 % (ref 12–46)
MCH: 29.9 pg (ref 26.0–34.0)
MCV: 88.2 fL (ref 78.0–100.0)
Monocytes Absolute: 0.6 10*3/uL (ref 0.1–1.0)
RDW: 13.5 % (ref 11.5–15.5)
WBC: 8.4 10*3/uL (ref 4.0–10.5)

## 2012-02-25 LAB — COMPREHENSIVE METABOLIC PANEL
Albumin: 4.4 g/dL (ref 3.5–5.2)
BUN: 8 mg/dL (ref 6–23)
Calcium: 9.2 mg/dL (ref 8.4–10.5)
Chloride: 106 mEq/L (ref 96–112)
Glucose, Bld: 44 mg/dL — CL (ref 70–99)
Potassium: 3.6 mEq/L (ref 3.5–5.3)

## 2012-02-25 LAB — POCT URINALYSIS DIPSTICK
Glucose, UA: NEGATIVE
Spec Grav, UA: >=1.03

## 2012-02-25 LAB — LIPID PANEL
HDL: 42 mg/dL (ref >39)
LDL Cholesterol: 64 mg/dL (ref 0–99)
Triglycerides: 154 mg/dL — ABNORMAL HIGH (ref <150)
VLDL: 31 mg/dL (ref 0–40)

## 2012-02-25 LAB — POCT UA - MICROSCOPIC ONLY: Yeast, UA: NEGATIVE

## 2012-02-25 LAB — HEMOGLOBIN A1C
Hgb A1c MFr Bld: 5.8 % — ABNORMAL HIGH (ref <5.7)
Mean Plasma Glucose: 120 mg/dL — ABNORMAL HIGH (ref <117)

## 2012-02-25 LAB — TSH: TSH: 2.532 u[IU]/mL (ref 0.350–4.500)

## 2012-02-25 MED ORDER — ESCITALOPRAM OXALATE 10 MG PO TABS: 20.0000 mg | ORAL_TABLET | Freq: Every day | ORAL | Status: DC

## 2012-02-25 NOTE — Assessment & Plan Note (Signed)
Stable; obtain labs; continue with dietary modifications.

## 2012-02-25 NOTE — Assessment & Plan Note (Signed)
Stable; appointment with Dr. Sandria Manly in 03/2012 yet pt not aware that Dr. Sandria Manly retiring soon; advised to contact neurology office regarding new neurologist and upcoming appointment.

## 2012-02-25 NOTE — Progress Notes (Signed)
[address]   [place], [place]  [address]   [phone]  Subjective:    Patient ID: Christie Benjamin, female    DOB: June 21, 1960, 52 y.o.   MRN: [id number]  HPI This 52 y.o. female presents for four month follow-up:  1.  Depression/anxiety: four month follow-up; switched to Lexapro 10mg  daily at last visit.  Father hospitalized for new onset seizures; has Parkinson's; age 41; etiology of seizures unclear.  Was admitted at [facility] but transferred to [facility] for continuous EEG.  Non-epileptic seizures.  Mean, hateful, ugly recently.  Snappy with husband.  Staying with parents currently to provide care.  Even before father's declining health, husband states that Lexapro not working.  Sleeping well.  Mother is 17 years old.  Father still having seizure; pseudoseizures?  Adding depression medication to even out Parkinson's disease.  Follow-up this week with Parkinson's neurologist.  Mom is driving crazy.  No SI/HI.  Mother-in-law is driving crazy.  Raises English Bulldogs.  Fred retired 01/09/2012.  Husband took on the dogs.  Husband age 67; retired from [employer] 30 years.  Loves to interact with dogs.    2.  Eye swelling and drainage:  Onset the past week.  Conjunctiva red.  No redness now.  Has been washing it out really well.  No fever/chill/sweats.  Applying eye drops OTC.  No sore throat; +nasal congestion; +ear pain.  Mild rhinorrhea.    3. Hypercholesterolemia:  Compliance with medication.  Four month follow-up; no changes to management made at last visit.  Reports good tolerance to medication and good symptom control.  4.  Glucose Intolerance: four month follow-up; continues with dietary modification.  Denies polydipsia, polyuria.  5.  Muscle weakness and neuropathy:  Had a bad fall.  Walking up icy incline to feed cats; fell.  Larey Seat again as grapping for the door.  Worried about bladder stimulator.  Very sore yesterday but feeling well.  Scheduled to see Dr. Sandria Manly next month.  Last  visit with [facility] was five months ago.  Visit spread to six months.    6. Urinary retention:  Recent follow-up with urology; released; battery life is 3-5 years; only follow-up PRN.     Review of Systems  Constitutional: Negative for fever, chills, diaphoresis and fatigue.  HENT: Positive for ear pain and congestion. Negative for sore throat, voice change and sinus pressure.   Eyes: Positive for discharge, redness and itching. Negative for photophobia and visual disturbance.  Respiratory: Negative for cough and shortness of breath.   Cardiovascular: Negative for chest pain, palpitations and leg swelling.  Gastrointestinal: Positive for diarrhea and constipation. Negative for nausea, vomiting and abdominal pain.  Endocrine: Negative for cold intolerance, heat intolerance, polydipsia, polyphagia and polyuria.  Genitourinary: Positive for difficulty urinating.  Neurological: Positive for weakness and numbness. Negative for dizziness, tremors, seizures, syncope, facial asymmetry, speech difficulty, light-headedness and headaches.  Psychiatric/Behavioral: Positive for sleep disturbance and dysphoric mood. Negative for suicidal ideas and self-injury. The patient is nervous/anxious.         Past Medical History  Diagnosis Date  . GERD (gastroesophageal reflux disease)   . Pain in joint, shoulder region   . Neuralgia, neuritis, and radiculitis, unspecified   . Diverticulosis of colon (without mention of hemorrhage)   . Disturbance of skin sensation   . Tobacco use disorder   . Personal history of alcoholism   . Depressive disorder, not elsewhere classified   . Pure hypercholesterolemia   . Allergic rhinitis,  cause unspecified   . Myalgia and myositis, unspecified   . Neurogenic bladder, NOS   . Other abnormal glucose   . Internal hemorrhoids without mention of complication   . Unspecified venous (peripheral) insufficiency     Past Surgical History  Procedure Laterality Date  .  Temporomandibular joint surgery    . Vaginal hysterectomy  1996    endometriosis  ovaries removed.  . Breast enhancement surgery    . Interstim implant placement      Prior to Admission medications   Medication Sig Start Date End Date Taking? Authorizing Provider  aspirin 81 MG tablet Take 81 mg by mouth daily.   Yes Historical Provider, MD  baclofen (LIORESAL) 10 MG tablet Take 10 mg by mouth 3 (three) times daily.   Yes Historical Provider, MD  cholecalciferol (VITAMIN D) 1000 UNITS tablet Take 1,000 Units by mouth daily.   Yes Historical Provider, MD  escitalopram (LEXAPRO) 10 MG tablet Take 2 tablets (20 mg total) by mouth daily. 02/25/12  Yes Ethelda Chick, MD  fluticasone (FLONASE) 50 MCG/ACT nasal spray Place 2 sprays into the nose daily. 10/29/11  Yes Ethelda Chick, MD  furosemide (LASIX) 20 MG tablet Take 20 mg by mouth daily.   Yes Historical Provider, MD  HYDROcodone-acetaminophen (VICODIN) 5-500 MG per tablet Take 1 tablet by mouth every 6 (six) hours as needed.   Yes Historical Provider, MD  ibuprofen (ADVIL,MOTRIN) 800 MG tablet Take 800 mg by mouth every 8 (eight) hours as needed.   Yes Historical Provider, MD  omeprazole (PRILOSEC) 20 MG capsule Take 20 mg by mouth 2 (two) times daily.   Yes Historical Provider, MD  potassium chloride (KLOR-CON) 20 MEQ packet Take 20 mEq by mouth daily.   Yes Historical Provider, MD  pregabalin (LYRICA) 150 MG capsule Take 150 mg by mouth 4 (four) times daily.   Yes Historical Provider, MD  simvastatin (ZOCOR) 40 MG tablet Take 40 mg by mouth every evening.   Yes Historical Provider, MD  zolpidem (AMBIEN CR) 12.5 MG CR tablet TAKE 1 TABLET AT BEDTIME 12/20/11  Yes Ryan M Dunn, PA-C  Casanthranol-Docusate Sodium 30-100 MG CAPS Take 100 mg by mouth daily.    Historical Provider, MD  sertraline (ZOLOFT) 100 MG tablet Take 100 mg by mouth daily. Take one and a half tablets daily    Historical Provider, MD    Allergies  Allergen Reactions  .  Septra Ds (Sulfamethoxazole W-Trimethoprim) Hives    History   Social History  . Marital Status: Married    Spouse Name: N/A    Number of Children: 2  . Years of Education: 12   Occupational History  . unemployed     not looking for work since 2008   Social History Main Topics  . Smoking status: Current Every Day Smoker -- 1.00 packs/day for 30 years    Types: Cigarettes  . Smokeless tobacco: Not on file  . Alcohol Use: No     Comment: recovering alcoholic 2009  . Drug Use: No  . Sexually Active: Not on file   Other Topics Concern  . Not on file   Social History Narrative   Always uses seat belts. Smoke alarm and carbon monoxide detectors in the home. Guns in the home,stored in locked cabinet.  Pets: dog. Married x 4 years Third marriage Happily married, no abuse. Exercise: Does not exercise daily works out in the yard. Lives with spouse and mother in law.Well balanced diet, caffeine  use Coffee. 3 servings/day redbull trying to stop. Living Will pat does not have living will, DOES have HCPOA. Organ Donor:YES.    Family History  Problem Relation Age of Onset  . Asthma Mother   . Hyperlipidemia Father   . Cancer Sister     skin, fingers, spread to bone  . Stroke    . Parkinsonism Father     Objective:   Physical Exam  Nursing note and vitals reviewed. Constitutional: She is oriented to person, place, and time. She appears well-developed and well-nourished. No distress.  HENT:  Head: Normocephalic and atraumatic.  Right Ear: External ear normal.  Left Ear: External ear normal.  Nose: Nose normal.  Mouth/Throat: Oropharynx is clear and moist.  Eyes: Conjunctivae and EOM are normal. Pupils are equal, round, and reactive to light.  Neck: Normal range of motion. Neck supple. No thyromegaly present.  Cardiovascular: Normal rate, regular rhythm, normal heart sounds and intact distal pulses.  Exam reveals no gallop and no friction rub.   No murmur heard. Pulmonary/Chest:  Effort normal and breath sounds normal. She has no wheezes. She has no rales.  Abdominal: Soft. Bowel sounds are normal. There is no tenderness. There is no rebound and no guarding.  Musculoskeletal:       Right shoulder: She exhibits decreased range of motion. She exhibits no tenderness and no bony tenderness.       Left shoulder: Normal.       Cervical back: Normal.       Thoracic back: Normal.       Lumbar back: Normal.  Lymphadenopathy:    She has no cervical adenopathy.  Neurological: She is alert and oriented to person, place, and time. No cranial nerve deficit. She exhibits normal muscle tone. Coordination normal.  Skin: Skin is warm and dry. No rash noted. She is not diaphoretic.  Psychiatric: She has a normal mood and affect. Her behavior is normal. Judgment and thought content normal.       Assessment & Plan:

## 2012-02-25 NOTE — Assessment & Plan Note (Signed)
Controlled; obtain labs; continue current medication. 

## 2012-02-25 NOTE — Assessment & Plan Note (Signed)
Uncontrolled; increase Lexapro to 20mg  daily.  Follow-up in 2 months.  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

## 2012-02-25 NOTE — Assessment & Plan Note (Signed)
Controlled with bladder stimulator; doing well.

## 2012-02-25 NOTE — Patient Instructions (Addendum)
Pure hypercholesterolemia  Routine general medical examination at a health care facility - Plan: CBC with Differential, CK, Lipid panel, TSH, Vitamin B12, Vitamin D 25 hydroxy, Folate, POCT urinalysis dipstick, EKG 12-Lead, Comprehensive metabolic panel, Hemoglobin A1c, POCT UA - Microscopic Only, CANCELED: Microalbumin, urine  Glucose intolerance (impaired glucose tolerance)  Depression with anxiety - Plan: escitalopram (LEXAPRO) 10 MG tablet

## 2012-02-26 LAB — VITAMIN D 25 HYDROXY (VIT D DEFICIENCY, FRACTURES): Vit D, 25-Hydroxy: 34 ng/mL (ref 30–89)

## 2012-03-03 ENCOUNTER — Ambulatory Visit: Payer: Self-pay | Admitting: Emergency Medicine

## 2012-03-03 ENCOUNTER — Encounter: Payer: Self-pay | Admitting: Radiology

## 2012-03-11 ENCOUNTER — Ambulatory Visit: Payer: Self-pay | Admitting: Family Medicine

## 2012-04-25 ENCOUNTER — Other Ambulatory Visit: Payer: Self-pay

## 2012-04-25 MED ORDER — SIMVASTATIN 40 MG PO TABS: 40.0000 mg | ORAL_TABLET | Freq: Every evening | ORAL | Status: DC

## 2012-04-28 ENCOUNTER — Ambulatory Visit (INDEPENDENT_AMBULATORY_CARE_PROVIDER_SITE_OTHER): Payer: Managed Care, Other (non HMO) | Admitting: Family Medicine

## 2012-04-28 ENCOUNTER — Encounter: Payer: Self-pay | Admitting: Family Medicine

## 2012-04-28 VITALS — BP 114/74 | HR 77 | Temp 98.1°F | Resp 18 | Ht 68.0 in | Wt 183.0 lb

## 2012-04-28 DIAGNOSIS — M6281 Muscle weakness (generalized): Secondary | ICD-10-CM

## 2012-04-28 DIAGNOSIS — F341 Dysthymic disorder: Secondary | ICD-10-CM

## 2012-04-28 DIAGNOSIS — J309 Allergic rhinitis, unspecified: Secondary | ICD-10-CM

## 2012-04-28 DIAGNOSIS — F418 Other specified anxiety disorders: Secondary | ICD-10-CM

## 2012-04-28 DIAGNOSIS — G4700 Insomnia, unspecified: Secondary | ICD-10-CM

## 2012-04-28 MED ORDER — VENLAFAXINE HCL ER 75 MG PO CP24: ORAL_CAPSULE | ORAL | Status: DC

## 2012-04-28 NOTE — Progress Notes (Signed)
[address]   [place], [place]  [address]   [phone]  Subjective:    Patient ID: Christie Benjamin, female    DOB: 05/12/1960, 52 y.o.   MRN: [id number]  HPI This 52 y.o. female presents for two month follow-up for anxiety/depression.  Management at last visit included increasing Lexapro 10mg  to two daily.  Very irritable; very high strung.  Sister in law still here; has been here since November 2013.  Short-tempered.  Very high strung.  No SI.  No alcohol.  Snappy, quick.  Mother in law now 69 year old.  Underwent counseling in past with alcoholism and severe depression; [facility] from [facility].  Good support from parents.  Tried Cymbalta and was intolerant to medication; medication was horrible.  Previous Prozac; no problems in past; previous Xanax.  No previous Paxil.  No previous Celexa.  Worried about bipolar?    Only taking Ambien ER 12.5mg  1/2 qhs.  2.  Muscle weakness:  Next appointment with [facility] of neurology.  Last appointment with [facility] since last visit; gave a choice of neurologist.  Next appointment in 09/2012.  Just recovered with nerve pain x 3 weeks.  Chronic pain also playing a role in overall mood/irritability.  3.  Allergic rhinitis:  Worsening; mother in law runs the heat constantly.  Taking Allegra.  +nasal congestion; +rhinorrhea.   Review of Systems  Constitutional: Positive for fatigue. Negative for fever, chills and diaphoresis.  HENT: Positive for congestion, rhinorrhea, sneezing, voice change and postnasal drip. Negative for ear pain, sore throat and trouble swallowing.   Eyes: Negative for photophobia and visual disturbance.  Respiratory: Negative for cough and shortness of breath.   Musculoskeletal: Positive for myalgias and gait problem.  Neurological: Positive for weakness and numbness.  Psychiatric/Behavioral: Positive for sleep disturbance and dysphoric mood. Negative for suicidal ideas and self-injury. The patient is nervous/anxious.    Past Medical  History  Diagnosis Date  . GERD (gastroesophageal reflux disease)   . Pain in joint, shoulder region   . Neuralgia, neuritis, and radiculitis, unspecified   . Diverticulosis of colon (without mention of hemorrhage)   . Disturbance of skin sensation   . Tobacco use disorder   . Personal history of alcoholism   . Depressive disorder, not elsewhere classified   . Pure hypercholesterolemia   . Allergic rhinitis, cause unspecified   . Myalgia and myositis, unspecified   . Neurogenic bladder, NOS   . Other abnormal glucose   . Internal hemorrhoids without mention of complication   . Unspecified venous (peripheral) insufficiency    Current Outpatient Prescriptions on File Prior to Visit  Medication Sig Dispense Refill  . aspirin 81 MG tablet Take 81 mg by mouth daily.      . baclofen (LIORESAL) 10 MG tablet Take 10 mg by mouth 3 (three) times daily.      Jennette Banker Sodium 30-100 MG CAPS Take 100 mg by mouth daily.      . cholecalciferol (VITAMIN D) 1000 UNITS tablet Take 1,000 Units by mouth daily.      Marland Kitchen escitalopram (LEXAPRO) 10 MG tablet Take 2 tablets (20 mg total) by mouth daily.  30 tablet  5  . fluticasone (FLONASE) 50 MCG/ACT nasal spray Place 2 sprays into the nose daily.  16 g  6  . furosemide (LASIX) 20 MG tablet Take 20 mg by mouth daily.      Marland Kitchen HYDROcodone-acetaminophen (VICODIN) 5-500 MG per tablet Take 1 tablet by mouth every 6 (  six) hours as needed.      Marland Kitchen ibuprofen (ADVIL,MOTRIN) 800 MG tablet Take 800 mg by mouth every 8 (eight) hours as needed.      Marland Kitchen omeprazole (PRILOSEC) 20 MG capsule Take 20 mg by mouth 2 (two) times daily.      . potassium chloride (KLOR-CON) 20 MEQ packet Take 20 mEq by mouth daily.      . pregabalin (LYRICA) 150 MG capsule Take 150 mg by mouth 4 (four) times daily.      . simvastatin (ZOCOR) 40 MG tablet Take 1 tablet (40 mg total) by mouth every evening.  30 tablet  3  . zolpidem (AMBIEN CR) 12.5 MG CR tablet TAKE 1 TABLET AT BEDTIME   30 tablet  0  . sertraline (ZOLOFT) 100 MG tablet Take 100 mg by mouth daily. Take one and a half tablets daily       No current facility-administered medications on file prior to visit.   History   Social History  . Marital Status: Married    Spouse Name: N/A    Number of Children: 2  . Years of Education: 12   Occupational History  . unemployed     not looking for work since 2008   Social History Main Topics  . Smoking status: Current Every Day Smoker -- 1.00 packs/day for 30 years    Types: Cigarettes  . Smokeless tobacco: Not on file  . Alcohol Use: No     Comment: recovering alcoholic 2009  . Drug Use: No  . Sexually Active: Not on file   Other Topics Concern  . Not on file   Social History Narrative   Always uses seat belts. Smoke alarm and carbon monoxide detectors in the home. Guns in the home,stored in locked cabinet.  Pets: dog. Married x 4 years Third marriage Happily married, no abuse. Exercise: Does not exercise daily works out in the yard. Lives with spouse and mother in law.Well balanced diet, caffeine use Coffee. 3 servings/day redbull trying to stop. Living Will pat does not have living will, DOES have HCPOA. Organ Donor:YES.       Objective:   Physical Exam  Nursing note and vitals reviewed. Constitutional: She is oriented to person, place, and time. She appears well-developed and well-nourished. No distress.  HENT:  Head: Normocephalic and atraumatic.  Right Ear: External ear normal.  Left Ear: External ear normal.  Nose: Nose normal.  Mouth/Throat: Oropharynx is clear and moist.  Eyes: Conjunctivae and EOM are normal. Pupils are equal, round, and reactive to light.  Neck: Normal range of motion. Neck supple. No thyromegaly present.  Cardiovascular: Normal rate, regular rhythm and normal heart sounds.   Pulmonary/Chest: Effort normal and breath sounds normal. She has no wheezes. She has no rales.  Lymphadenopathy:    She has no cervical adenopathy.    Neurological: She is alert and oriented to person, place, and time. No cranial nerve deficit. She exhibits normal muscle tone. Coordination normal.  Skin: Skin is warm and dry. She is not diaphoretic.  Psychiatric: She has a normal mood and affect. Her behavior is normal. Judgment and thought content normal.       Assessment & Plan:  Depression with anxiety - Plan: venlafaxine XR (EFFEXOR-XR) 75 MG 24 hr capsule  Insomnia  Allergic Rhinitis  Muscle Weakness  1. Depression with anxiety:  Worsening on Lexapro 20mg  daily.  Wean Lexapro over the next month; decrease to one tablet daily for two weeks and then  decrease to 1/2 tablet daily x 1 week and then stop.  Rx for Effexor XR 75mg  daily provided.  If no improvement with Effexor, recommend referral to psychiatry.  Also highly encourage intensive psychotherapy; pt to consider. 2.  Insomnia:  Controlled.  Pt only taking Ambien ER 6.25mg  daily.  Discussed side effect profile to Ambien and new recommendations of lower dose in females. 3.  Allergic Rhinitis: Uncontrolled; worsening.  Continue Allegra daily.  Recommend adding Flonase daily. 4.  Muscle Weakness: stable; to establish with new neurologist in 09/2012.  Meds ordered this encounter  Medications  . venlafaxine XR (EFFEXOR-XR) 75 MG 24 hr capsule    Sig: One tablet daily for one month then increase to two tablets daily    Dispense:  60 capsule    Refill:  2

## 2012-04-28 NOTE — Patient Instructions (Addendum)
1.  DECREASE LEXAPRO TO ONE TABLET DAILY FOR TWO WEEKS; THEN DECREASE TO ONE EVERY OTHER DAY FOR TWO WEEKS. 2.  START EFFEXOR ONE DAILY NOW.

## 2012-05-14 ENCOUNTER — Other Ambulatory Visit: Payer: Self-pay

## 2012-05-14 MED ORDER — PREGABALIN 150 MG PO CAPS: 150.0000 mg | ORAL_CAPSULE | Freq: Four times a day (QID) | ORAL | Status: DC

## 2012-05-14 MED ORDER — HYDROCODONE-ACETAMINOPHEN 5-325 MG PO TABS: 1.0000 | ORAL_TABLET | Freq: Two times a day (BID) | ORAL | Status: DC

## 2012-05-14 NOTE — Telephone Encounter (Signed)
Former [facility] patient assigned to Dr Yan  

## 2012-06-09 ENCOUNTER — Telehealth: Payer: Self-pay | Admitting: Neurology

## 2012-06-09 NOTE — Telephone Encounter (Signed)
Patient called stating Vicodin isn't working and would like to speak with physician.

## 2012-06-12 ENCOUNTER — Telehealth: Payer: Self-pay | Admitting: Neurology

## 2012-06-12 NOTE — Telephone Encounter (Signed)
Pt is calling back stating no one has called her back from the previous 3 calls that she has made here. She needs someone to call her about her vicatin not working. Thanks

## 2012-06-12 NOTE — Telephone Encounter (Signed)
Phone note from 06-09-12 has been sent to the physician and just waiting on response.

## 2012-06-13 NOTE — Telephone Encounter (Signed)
called and spoke with the patient to inform her that per Dr. Zannie Cove note she will not make any changes until she see her on the next appt.

## 2012-06-13 NOTE — Telephone Encounter (Signed)
Chart reviewed, patient of Dr. Sandria Manly, Last office visit September 2013, variable neurological symptoms, also with depression anxiety, on polypharmacy treatment, she was also evaluated by her primary care physician Dr. Katrinka Blazing regularly,  Please let her know, I will not change her medications until I evaluated her on next followup appointment

## 2012-06-16 ENCOUNTER — Ambulatory Visit: Payer: Managed Care, Other (non HMO) | Admitting: Family Medicine

## 2012-06-30 ENCOUNTER — Ambulatory Visit: Payer: Managed Care, Other (non HMO) | Admitting: Family Medicine

## 2012-07-04 ENCOUNTER — Ambulatory Visit: Payer: Self-pay | Admitting: Family Medicine

## 2012-07-07 ENCOUNTER — Other Ambulatory Visit: Payer: Self-pay | Admitting: Family Medicine

## 2012-07-09 NOTE — Telephone Encounter (Signed)
called

## 2012-07-10 ENCOUNTER — Other Ambulatory Visit: Payer: Self-pay

## 2012-07-10 MED ORDER — BACLOFEN 10 MG PO TABS: 15.0000 mg | ORAL_TABLET | Freq: Three times a day (TID) | ORAL | Status: DC

## 2012-07-10 NOTE — Telephone Encounter (Signed)
Former [facility] patient assigned to Dr Terrace Arabia.  Has an Appt scheduled in Sept.

## 2012-07-23 ENCOUNTER — Ambulatory Visit (INDEPENDENT_AMBULATORY_CARE_PROVIDER_SITE_OTHER): Payer: Managed Care, Other (non HMO) | Admitting: Family Medicine

## 2012-07-23 ENCOUNTER — Encounter: Payer: Self-pay | Admitting: Family Medicine

## 2012-07-23 VITALS — BP 111/75 | HR 83 | Temp 97.6°F | Resp 16 | Ht 67.5 in | Wt 184.0 lb

## 2012-07-23 DIAGNOSIS — R82998 Other abnormal findings in urine: Secondary | ICD-10-CM

## 2012-07-23 DIAGNOSIS — M6281 Muscle weakness (generalized): Secondary | ICD-10-CM

## 2012-07-23 DIAGNOSIS — R8290 Unspecified abnormal findings in urine: Secondary | ICD-10-CM

## 2012-07-23 DIAGNOSIS — F341 Dysthymic disorder: Secondary | ICD-10-CM

## 2012-07-23 DIAGNOSIS — F418 Other specified anxiety disorders: Secondary | ICD-10-CM

## 2012-07-23 DIAGNOSIS — R339 Retention of urine, unspecified: Secondary | ICD-10-CM

## 2012-07-23 LAB — POCT URINALYSIS DIPSTICK
Ketones, UA: NEGATIVE
Protein, UA: NEGATIVE
Urobilinogen, UA: 0.2
pH, UA: 7

## 2012-07-23 LAB — POCT UA - MICROSCOPIC ONLY
Casts, Ur, LPF, POC: NEGATIVE
Crystals, Ur, HPF, POC: NEGATIVE

## 2012-07-23 MED ORDER — VENLAFAXINE HCL ER 75 MG PO CP24: ORAL_CAPSULE | ORAL | Status: DC

## 2012-07-23 NOTE — Progress Notes (Signed)
[address]   [place], [place]  [address]   [phone]  Subjective:    Patient ID: Christie Benjamin, female    DOB: 1960-02-12, 52 y.o.   MRN: [id number]  HPI This 52 y.o. female presents for three month follow-up:  1.  Depression with anxiety:  Switched from Lexapro to Effexor at last visit three months ago.  Doing well.  No side effects to Effexor.  Took two weeks to improve.  Mother in law noticed improvement.  Taking Effexor 75mg  one tablet daily; did not increase to two tablets daily.  Sister-in-law still living with them.  Has granddaughter frequently during the week.  No SI/HI.  2.  Strong urine odor: onset two weeks ago.  No dysuria, frequency.  Stimulator in place.  No longer must I/O cath.  Nocturia x 1; no change from baseline.  No urinary retention.  No fever but +chills.    3.  Muscle weakness: worsening with heat; also gets chills; joints swell in hands.  Feels inflammation with heat.  Stiffness; unsteady gait.  Follow-up with neurology 09/12/12.    4.  Insomnia: would like to stop Ambien CR; tries to only take 1/4 of a tablet.  Review of Systems  Constitutional: Positive for chills. Negative for fever, diaphoresis and fatigue.  Genitourinary: Negative for dysuria, urgency, frequency, hematuria, flank pain, decreased urine volume and pelvic pain.  Neurological: Positive for weakness. Negative for dizziness, tremors, syncope, facial asymmetry, speech difficulty, light-headedness, numbness and headaches.  Psychiatric/Behavioral: Positive for sleep disturbance and dysphoric mood. Negative for suicidal ideas, self-injury and decreased concentration. The patient is nervous/anxious.    Past Medical History  Diagnosis Date  . GERD (gastroesophageal reflux disease)   . Pain in joint, shoulder region   . Neuralgia, neuritis, and radiculitis, unspecified   . Diverticulosis of colon (without mention of hemorrhage)   . Disturbance of skin sensation   . Tobacco use disorder   .  Personal history of alcoholism   . Depressive disorder, not elsewhere classified   . Pure hypercholesterolemia   . Allergic rhinitis, cause unspecified   . Myalgia and myositis, unspecified   . Neurogenic bladder, NOS   . Other abnormal glucose   . Internal hemorrhoids without mention of complication   . Unspecified venous (peripheral) insufficiency    Allergies  Allergen Reactions  . Septra Ds (Sulfamethoxazole W-Trimethoprim) Hives   Current Outpatient Prescriptions on File Prior to Visit  Medication Sig Dispense Refill  . aspirin 81 MG tablet Take 81 mg by mouth daily.      Jennette Banker Sodium 30-100 MG CAPS Take 100 mg by mouth daily.      . cholecalciferol (VITAMIN D) 1000 UNITS tablet Take 1,000 Units by mouth daily.      . fluticasone (FLONASE) 50 MCG/ACT nasal spray Place 2 sprays into the nose daily.  16 g  6  . HYDROcodone-acetaminophen (NORCO/VICODIN) 5-325 MG per tablet Take 1 tablet by mouth 2 (two) times daily.  60 tablet  5  . ibuprofen (ADVIL,MOTRIN) 800 MG tablet Take 800 mg by mouth every 8 (eight) hours as needed.      Marland Kitchen omeprazole (PRILOSEC) 20 MG capsule Take 20 mg by mouth 2 (two) times daily.      . potassium chloride (KLOR-CON) 20 MEQ packet Take 20 mEq by mouth daily.      . pregabalin (LYRICA) 150 MG capsule Take 1 capsule (150 mg total) by mouth 4 (four) times daily.  120 capsule  5  . simvastatin (ZOCOR) 40 MG tablet Take 1 tablet (40 mg total) by mouth every evening.  30 tablet  3  . zolpidem (AMBIEN CR) 12.5 MG CR tablet TAKE 1 TABLET BY MOUTH AT BEDTIME AS NEEDED  30 tablet  5   No current facility-administered medications on file prior to visit.   History   Social History  . Marital Status: Married    Spouse Name: N/A    Number of Children: 2  . Years of Education: 12   Occupational History  . unemployed     not looking for work since 2008   Social History Main Topics  . Smoking status: Current Every Day Smoker -- 1.00 packs/day  for 30 years    Types: Cigarettes  . Smokeless tobacco: Not on file  . Alcohol Use: No     Comment: recovering alcoholic 2009  . Drug Use: No  . Sexually Active: Not on file   Other Topics Concern  . Not on file   Social History Narrative   Always uses seat belts. Smoke alarm and carbon monoxide detectors in the home. Guns in the home,stored in locked cabinet.  Pets: dog. Married x 4 years Third marriage Happily married, no abuse. Exercise: Does not exercise daily works out in the yard. Lives with spouse and mother in law.Well balanced diet, caffeine use Coffee. 3 servings/day redbull trying to stop. Living Will pat does not have living will, DOES have HCPOA. Organ Donor:YES.       Objective:   Physical Exam  Nursing note and vitals reviewed. Constitutional: She is oriented to person, place, and time. She appears well-developed and well-nourished. No distress.  HENT:  Head: Normocephalic and atraumatic.  Mouth/Throat: Oropharynx is clear and moist.  Eyes: Conjunctivae are normal. Pupils are equal, round, and reactive to light.  Neck: Normal range of motion. Neck supple.  Cardiovascular: Normal rate, regular rhythm and normal heart sounds.   Pulmonary/Chest: Effort normal and breath sounds normal. She has no wheezes. She has no rales.  Abdominal: Soft. Bowel sounds are normal. She exhibits no distension. There is no tenderness. There is no rebound.  Neurological: She is alert and oriented to person, place, and time. No cranial nerve deficit. She exhibits normal muscle tone. Coordination normal.  Skin: She is not diaphoretic.  Psychiatric: She has a normal mood and affect. Her behavior is normal. Judgment and thought content normal.   Results for orders placed in visit on 07/23/12  URINE CULTURE      Result Value Range   Colony Count NO GROWTH     Organism ID, Bacteria NO GROWTH    POCT UA - MICROSCOPIC ONLY      Result Value Range   WBC, Ur, HPF, POC 0-1     RBC, urine,  microscopic 0-1     Bacteria, U Microscopic neg     Mucus, UA neg     Epithelial cells, urine per micros 0-4     Crystals, Ur, HPF, POC neg     Casts, Ur, LPF, POC neg     Yeast, UA eng    POCT URINALYSIS DIPSTICK      Result Value Range   Color, UA light yellow     Clarity, UA clear     Glucose, UA neg     Bilirubin, UA neg     Ketones, UA neg     Spec Grav, UA 1.010     Blood, UA neg  pH, UA 7.0     Protein, UA neg     Urobilinogen, UA 0.2     Nitrite, UA neg     Leukocytes, UA Trace         Assessment & Plan:  Bad odor of urine - Plan: POCT UA - Microscopic Only, POCT urinalysis dipstick, Urine culture  Depression with anxiety - Plan: venlafaxine XR (EFFEXOR-XR) 75 MG 24 hr capsule  Muscle weakness  Urinary retention   1.  Odor to Urine:  New.  Send urine culture; obtain u/a.  2.  Depression with anxiety: Improved with Efffexor; recommend increasing to 150mg  daily. 3.  Muscle weakness: persistent; appointment with new neurologist in 09/2012. 4. Urinary Retention: controlled with stimulator.  No recent issues.  Meds ordered this encounter  Medications  . venlafaxine XR (EFFEXOR-XR) 75 MG 24 hr capsule    Sig: One tablet daily for one month then increase to two tablets daily    Dispense:  60 capsule    Refill:  5

## 2012-07-27 ENCOUNTER — Other Ambulatory Visit: Payer: Self-pay | Admitting: Neurology

## 2012-07-29 ENCOUNTER — Other Ambulatory Visit: Payer: Self-pay | Admitting: Family Medicine

## 2012-07-30 ENCOUNTER — Other Ambulatory Visit: Payer: Self-pay

## 2012-07-30 MED ORDER — FUROSEMIDE 20 MG PO TABS: 20.0000 mg | ORAL_TABLET | Freq: Every day | ORAL | Status: DC

## 2012-08-06 ENCOUNTER — Telehealth: Payer: Self-pay | Admitting: Neurology

## 2012-08-06 NOTE — Telephone Encounter (Signed)
Pt called to confirm

## 2012-08-21 ENCOUNTER — Telehealth: Payer: Self-pay | Admitting: Neurology

## 2012-08-26 NOTE — Telephone Encounter (Signed)
Chart reviewed, will not manage her pain medications

## 2012-08-29 ENCOUNTER — Other Ambulatory Visit: Payer: Self-pay | Admitting: Family Medicine

## 2012-09-12 ENCOUNTER — Ambulatory Visit: Payer: Managed Care, Other (non HMO) | Admitting: Neurology

## 2012-10-01 ENCOUNTER — Other Ambulatory Visit: Payer: Self-pay | Admitting: Neurology

## 2012-10-03 NOTE — Telephone Encounter (Signed)
Rx signed and faxed.

## 2012-10-27 ENCOUNTER — Ambulatory Visit (INDEPENDENT_AMBULATORY_CARE_PROVIDER_SITE_OTHER): Payer: Managed Care, Other (non HMO) | Admitting: Family Medicine

## 2012-10-27 ENCOUNTER — Encounter: Payer: Self-pay | Admitting: Family Medicine

## 2012-10-27 VITALS — BP 124/76 | HR 79 | Temp 98.5°F | Resp 16 | Ht 67.0 in | Wt 182.8 lb

## 2012-10-27 DIAGNOSIS — F418 Other specified anxiety disorders: Secondary | ICD-10-CM

## 2012-10-27 DIAGNOSIS — Z01419 Encounter for gynecological examination (general) (routine) without abnormal findings: Secondary | ICD-10-CM

## 2012-10-27 DIAGNOSIS — Z0000 Encounter for general adult medical examination without abnormal findings: Secondary | ICD-10-CM

## 2012-10-27 DIAGNOSIS — F17200 Nicotine dependence, unspecified, uncomplicated: Secondary | ICD-10-CM

## 2012-10-27 DIAGNOSIS — F329 Major depressive disorder, single episode, unspecified: Secondary | ICD-10-CM

## 2012-10-27 DIAGNOSIS — Z720 Tobacco use: Secondary | ICD-10-CM

## 2012-10-27 DIAGNOSIS — J309 Allergic rhinitis, unspecified: Secondary | ICD-10-CM

## 2012-10-27 LAB — POCT URINALYSIS DIPSTICK
Bilirubin, UA: NEGATIVE
Glucose, UA: NEGATIVE
Ketones, UA: NEGATIVE
Spec Grav, UA: 1.015
Urobilinogen, UA: 0.2

## 2012-10-27 LAB — LIPID PANEL
HDL: 46 mg/dL (ref >39)
LDL Cholesterol: 75 mg/dL (ref 0–99)
Total CHOL/HDL Ratio: 3.3 Ratio
VLDL: 33 mg/dL (ref 0–40)

## 2012-10-27 LAB — POCT UA - MICROSCOPIC ONLY
Bacteria, U Microscopic: NEGATIVE
Casts, Ur, LPF, POC: NEGATIVE
Crystals, Ur, HPF, POC: NEGATIVE

## 2012-10-27 LAB — CBC WITH DIFFERENTIAL/PLATELET
Basophils Relative: 0 % (ref 0–1)
Hemoglobin: 12.6 g/dL (ref 12.0–15.0)
MCHC: 34 g/dL (ref 30.0–36.0)
Monocytes Relative: 7 % (ref 3–12)
Neutro Abs: 6.2 10*3/uL (ref 1.7–7.7)
Neutrophils Relative %: 68 % (ref 43–77)
RBC: 4.23 MIL/uL (ref 3.87–5.11)

## 2012-10-27 LAB — TSH: TSH: 0.547 u[IU]/mL (ref 0.350–4.500)

## 2012-10-27 LAB — COMPREHENSIVE METABOLIC PANEL
ALT: 15 U/L (ref 0–35)
AST: 21 U/L (ref 0–37)
Alkaline Phosphatase: 79 U/L (ref 39–117)
CO2: 27 mEq/L (ref 19–32)
Sodium: 142 mEq/L (ref 135–145)
Total Bilirubin: 0.3 mg/dL (ref 0.3–1.2)
Total Protein: 6.2 g/dL (ref 6.0–8.3)

## 2012-10-27 LAB — CK: Total CK: 61 U/L (ref 7–177)

## 2012-10-27 MED ORDER — ZOLPIDEM TARTRATE ER 12.5 MG PO TBCR: 12.5000 mg | EXTENDED_RELEASE_TABLET | Freq: Every evening | ORAL | Status: DC | PRN

## 2012-10-27 MED ORDER — IBUPROFEN 800 MG PO TABS: 800.0000 mg | ORAL_TABLET | Freq: Three times a day (TID) | ORAL | Status: DC | PRN

## 2012-10-27 MED ORDER — POTASSIUM CHLORIDE 20 MEQ PO PACK: 20.0000 meq | PACK | Freq: Every day | ORAL | Status: DC

## 2012-10-27 MED ORDER — FLUTICASONE PROPIONATE 50 MCG/ACT NA SUSP: 2.0000 | Freq: Every day | NASAL | Status: DC

## 2012-10-27 MED ORDER — SIMVASTATIN 40 MG PO TABS: 40.0000 mg | ORAL_TABLET | Freq: Every day | ORAL | Status: DC

## 2012-10-27 MED ORDER — FUROSEMIDE 20 MG PO TABS: 20.0000 mg | ORAL_TABLET | Freq: Every day | ORAL | Status: DC

## 2012-10-27 MED ORDER — VENLAFAXINE HCL ER 75 MG PO CP24: ORAL_CAPSULE | ORAL | Status: DC

## 2012-10-27 NOTE — Progress Notes (Signed)
Subjective:    Patient ID: Christie Benjamin, female    DOB: August 22, 1960, 52 y.o.   MRN: [id number]  HPI CPE-10/04/11 Last PAP-10/04/11 Mammo- 10/03/12 Colonoscopy- 2010, due this year. Requests Dr. Bluford Kaufmann. Tetanus- 2010 Flu- neuro recommends that she not have Pneumovax- recommended, patient declines. Eye- going 1/14, no glaucoma or cataracts Dentist- doesn't see. Had TMJ surgery in past. Getting ready to have all teeth removed.  PSH- TAH 1988, endometriosis. No uterine or cervical cancer TMJ surgery  FH- Mother 49- living, asthma, arthritis in hips, hands; elevated cholesteol Father- high cholesterol, HTN, Parkinson's  Sisters- one with cancer ? Bone. Had finger amputated Brother- no health problems Grandmother with breast CA in her 110s.  SH- Happily married x 6 years.No abuse. Husband retired in January. They have 5 dogs. Children 28,32. Grandchildren 3-16. Smokes 1 ppd. ETOH- none Lives with husband, MIL, SIL  Urology symptoms- Sees Dr. Claudie Revering, urologist at [facility]. Has to self cath BID for urinary retention.   Having a lot of inflammation in arms- shoulders to hands. Takes ibuprofen 800 mg with some relief.  More anxiety lately. Taking Effexor XR 150 mg. Has to take care of MIL. Not interested in counseling. Has good support systems. Denies SI, just wants to lay in bed and sleep. Not sleeping well- getting up to urinate.  Review of Systems Hearing good, no ringing. Dizziness with bending over, better. Occasional headaches No sores in mouth that won't heal. Left side of neck swelling. No teeth pain.  No chest pain, occasional palpitation with anxiety. No SOB. No cough, no snoring.   Frequent neck pain. Pain from elbows to armpits. Checks breasts. Bowels unchanged, no blood in stool. Takes stool softener every day. No nausea or vomiting. Night sweats- likes windows open.  Objective:   Physical Exam  Nursing note and vitals reviewed. Constitutional: She is oriented to  person, place, and time. She appears well-developed and well-nourished. No distress.  HENT:  Right Ear: External ear normal.  Left Ear: External ear normal.  Nose: Nose normal.  Mouth/Throat: Oropharynx is clear and moist.  Eyes: Conjunctivae are normal. Right eye exhibits no discharge.  Neck: Normal range of motion. Neck supple.  Cardiovascular: Normal rate, regular rhythm and normal heart sounds.   Pulmonary/Chest: Effort normal and breath sounds normal.  Abdominal: Soft. Bowel sounds are normal.  Genitourinary: Rectum normal and vagina normal. No breast swelling, tenderness, discharge or bleeding. Pelvic exam was performed with patient supine. There is no rash, tenderness or lesion on the right labia. There is no rash, tenderness or lesion on the left labia. Right adnexum displays no mass, no tenderness and no fullness. Left adnexum displays no mass, no tenderness and no fullness.  Musculoskeletal: Normal range of motion.  Lymphadenopathy:    She has no cervical adenopathy.  Neurological: She is alert and oriented to person, place, and time.  Skin: Skin is warm and dry. She is not diaphoretic.  Psychiatric: She has a normal mood and affect. Her behavior is normal. Judgment and thought content normal.      Assessment & Plan:  Routine general medical examination at a health care facility - Plan: CBC with Differential, CK, Comprehensive metabolic panel, Hemoglobin A1c, Lipid panel, TSH, Vitamin B12, Iron, Folate, Vit D  25 hydroxy (rtn osteoporosis monitoring), POCT urinalysis dipstick, EKG 12-Lead, Incentive spirometry RT, Incentive spirometry RT, POCT UA - Microscopic Only, Ambulatory referral to Gastroenterology  Allergic rhinitis, cause unspecified - Plan: fluticasone (FLONASE) 50 MCG/ACT nasal spray  Depression  with anxiety - Plan: venlafaxine XR (EFFEXOR-XR) 75 MG 24 hr capsule  Routine gynecological examination - Plan: MM Digital Screening  Tobacco abuse  1.  CPE: anticipatory  guidance provided---weight loss, exercise, smoking cessation.  Refer for mammogram.  Does not warrant pap smear due to hysterectomy status.  Refer for colonoscopy.  Immunizations reviewed; pt declined flu vaccine and Pneumovax.  Obtain labs. 2.  Gynecological exam: completed; refer for mammogram.  S/p hysterectomy.   3.  Allergic Rhinitis: stable; rx for Flonase provided. 4.  Depression with anxiety: worsening; no change in therapy; counseling provided. 5. Tobacco abuse: precontemplative.  Encourage cessation. 4.  Meds ordered this encounter  Medications  . fluticasone (FLONASE) 50 MCG/ACT nasal spray    Sig: Place 2 sprays into the nose daily.    Dispense:  16 g    Refill:  11  . ibuprofen (ADVIL,MOTRIN) 800 MG tablet    Sig: Take 1 tablet (800 mg total) by mouth every 8 (eight) hours as needed.    Dispense:  40 tablet    Refill:  3  . furosemide (LASIX) 20 MG tablet    Sig: Take 1 tablet (20 mg total) by mouth daily.    Dispense:  30 tablet    Refill:  11  . potassium chloride (KLOR-CON) 20 MEQ packet    Sig: Take 20 mEq by mouth daily.    Dispense:  30 tablet    Refill:  11  . simvastatin (ZOCOR) 40 MG tablet    Sig: Take 1 tablet (40 mg total) by mouth at bedtime.    Dispense:  30 tablet    Refill:  11  . venlafaxine XR (EFFEXOR-XR) 75 MG 24 hr capsule    Sig: 3 tablets daily    Dispense:  90 capsule    Refill:  11  . zolpidem (AMBIEN CR) 12.5 MG CR tablet    Sig: Take 1 tablet (12.5 mg total) by mouth at bedtime as needed for sleep.    Dispense:  30 tablet    Refill:  5   Nilda Simmer, M.D.  [facility] [address] [place], [place]  [address] [phone] phone [phone] fax

## 2012-10-27 NOTE — Progress Notes (Signed)
  Subjective:    Patient ID: Christie Benjamin, female    DOB: 12-21-1960, 52 y.o.   MRN: [id number]  HPI    Review of Systems  Constitutional: Positive for fatigue.  Eyes: Negative.   Respiratory: Negative.   Cardiovascular: Negative.   Gastrointestinal: Positive for diarrhea and constipation.  Endocrine: Negative.   Genitourinary: Positive for hematuria and difficulty urinating.  Musculoskeletal: Positive for arthralgias, gait problem, joint swelling and myalgias.  Skin: Negative.   Allergic/Immunologic: Negative.   Neurological: Positive for dizziness, seizures, weakness, light-headedness, numbness and headaches.  Hematological: Bruises/bleeds easily.  Psychiatric/Behavioral: The patient is nervous/anxious.        Objective:   Physical Exam        Assessment & Plan:

## 2012-10-28 ENCOUNTER — Other Ambulatory Visit: Payer: Self-pay | Admitting: Physician Assistant

## 2012-10-28 LAB — VITAMIN D 25 HYDROXY (VIT D DEFICIENCY, FRACTURES): Vit D, 25-Hydroxy: 44 ng/mL (ref 30–89)

## 2012-10-28 LAB — HEMOGLOBIN A1C: Hgb A1c MFr Bld: 6 % — ABNORMAL HIGH (ref <5.7)

## 2012-11-06 ENCOUNTER — Telehealth: Payer: Self-pay | Admitting: Neurology

## 2012-11-10 ENCOUNTER — Other Ambulatory Visit: Payer: Self-pay

## 2012-11-10 MED ORDER — HYDROCODONE-ACETAMINOPHEN 5-325 MG PO TABS: ORAL_TABLET | ORAL | Status: DC

## 2012-11-11 ENCOUNTER — Telehealth: Payer: Self-pay | Admitting: Neurology

## 2012-11-11 NOTE — Telephone Encounter (Signed)
Call pt and left message that her prescription was ready to be picked up and if she has any questions or concerns to call the office.

## 2012-11-24 NOTE — Telephone Encounter (Signed)
I have not had access to Epic for over 2 weeks.  By viewing the patients chart, this has already been taken care of.

## 2012-12-01 ENCOUNTER — Encounter: Payer: Self-pay | Admitting: Neurology

## 2012-12-01 ENCOUNTER — Ambulatory Visit (INDEPENDENT_AMBULATORY_CARE_PROVIDER_SITE_OTHER): Payer: Managed Care, Other (non HMO) | Admitting: Neurology

## 2012-12-01 VITALS — BP 117/72 | HR 82 | Ht 67.0 in | Wt 178.0 lb

## 2012-12-01 DIAGNOSIS — R269 Unspecified abnormalities of gait and mobility: Secondary | ICD-10-CM | POA: Insufficient documentation

## 2012-12-01 MED ORDER — BACLOFEN 10 MG PO TABS: 15.0000 mg | ORAL_TABLET | Freq: Three times a day (TID) | ORAL | Status: DC

## 2012-12-01 MED ORDER — PREGABALIN 150 MG PO CAPS: 150.0000 mg | ORAL_CAPSULE | Freq: Four times a day (QID) | ORAL | Status: DC

## 2012-12-01 MED ORDER — HYDROCODONE-ACETAMINOPHEN 5-325 MG PO TABS: ORAL_TABLET | ORAL | Status: DC

## 2012-12-02 NOTE — Progress Notes (Signed)
[facility]  PATIENT: Christie Benjamin DOB: 1960-05-14  HISTORICAL  Anglea is a 52 years old female, patient of Dr. Sandria Manly, most recent clinical visit was March 2013,  She had a past medical history of urethra stricture, associated with bladder symptoms since 2009, she also reported episodes of difficulty walking, has been admitted to [facility] twice, with left-sided weakness, she has underwent extensive evaluation,  Laboratory was normal or negative including B12, Lyme titer, HIV, sedimentation rate vision rate, ANA, VDRL,heavy mental screen   CSF was negative including HTLV-1/2, oligoclonal,   She had MRI imaging of the brain, and cervical in March, September, November 2009,  which were all normal, except for mild global brain atrophy,  She was treated with his possible Lyme disease in July 2010 with 3 weeks of amoxicillin by [facility] physician Dr. Orbie Pyo, later had performed multiple laboratory evaluation which was all normal.  She was also evaluated by Dr. Evern Bio, with negative MRI of cervical spine in November 2009.  She has a waxing and waning of her symptoms, preceded by sleepiness, headaches, left-sided weakness, fatigue, worsening bladder problem, pain in all extremity.  Over the years, she was treated with different medications, including gabapentin, Lyrica, titrating dose of baclofen,  Because of her age difficulty, she suffered fractured of her left arm in 2012.  She also had one seizure-like episodes  , Not responsive for 15 seconds, it had dropped, her hands trembling for 5 minutes, went to sleep afterwards, she had couple of dispels witting last year, in 2012, next  She eventually underwent InterStim implant in August 2013 by Dr. Enid Skeens at [facility], her urologist, reported improvement of her urinary urgency,spasticity, she still has to do self cath, but only twice a day now, continue have marked unsteady gait. She continues to  complain bilateral lower extremity spasticity, gait difficulty, diffuse body aching pain, is on polypharmacy treatment, including Lyrica 150 mg 4 times a day, baclofen 10 mg one and half tablets 3 times a day, hydrocodone/Tylenol 5/325 mg twice a day.   REVIEW OF SYSTEMS: Full 14 system review of systems performed and notable only for fever, chills, fatigue, spinning sensation, trouble swallowing, blurred vision, diarrhea, constipation, easy bruising, feeling hot, increased thirst, joint pain, joint swelling, cramps, achy muscles, allergies, memory loss, confusion, headaches, numbness, weakness, slurred speech, difficulty swallowing, dizziness, tremor, sleepiness, restless legs, decreased energy, racing thoughts,  ALLERGIES: Allergies  Allergen Reactions  . Septra Ds [Sulfamethoxazole-Trimethoprim] Hives    HOME MEDICATIONS: Outpatient Prescriptions Prior to Visit  Medication Sig Dispense Refill  . aspirin 81 MG tablet Take 81 mg by mouth daily.      Jennette Banker Sodium 30-100 MG CAPS Take 100 mg by mouth daily.      . cholecalciferol (VITAMIN D) 1000 UNITS tablet Take 1,000 Units by mouth daily.      . fluticasone (FLONASE) 50 MCG/ACT nasal spray Place 2 sprays into the nose daily.  16 g  11  . furosemide (LASIX) 20 MG tablet Take 1 tablet (20 mg total) by mouth daily.  30 tablet  11  . ibuprofen (ADVIL,MOTRIN) 800 MG tablet Take 1 tablet (800 mg total) by mouth every 8 (eight) hours as needed.  40 tablet  3  . omeprazole (PRILOSEC) 20 MG capsule Take 20 mg by mouth 2 (two) times daily.      . potassium chloride (KLOR-CON) 20 MEQ packet Take 20 mEq by mouth daily.  30 tablet  11  .  simvastatin (ZOCOR) 40 MG tablet Take 1 tablet (40 mg total) by mouth at bedtime.  30 tablet  11  . venlafaxine XR (EFFEXOR-XR) 75 MG 24 hr capsule 3 tablets daily  90 capsule  11  . zolpidem (AMBIEN CR) 12.5 MG CR tablet Take 1 tablet (12.5 mg total) by mouth at bedtime as needed for sleep.  30 tablet   5  . baclofen (LIORESAL) 10 MG tablet TAKE 1 1/2 TABLETS (15 MG TOTAL) BY MOUTH 3 (THREE) TIMES DAILY.  135 tablet  5  . HYDROcodone-acetaminophen (NORCO/VICODIN) 5-325 MG per tablet TAKE 1 TABLET BY MOUTH TWICE A DAY  60 tablet  0  . pregabalin (LYRICA) 150 MG capsule Take 1 capsule (150 mg total) by mouth 4 (four) times daily.  120 capsule  5   No facility-administered medications prior to visit.    PAST MEDICAL HISTORY: Past Medical History  Diagnosis Date  . GERD (gastroesophageal reflux disease)   . Pain in joint, shoulder region   . Neuralgia, neuritis, and radiculitis, unspecified   . Diverticulosis of colon (without mention of hemorrhage)   . Disturbance of skin sensation   . Tobacco use disorder   . Personal history of alcoholism   . Depressive disorder, not elsewhere classified   . Pure hypercholesterolemia   . Allergic rhinitis, cause unspecified   . Myalgia and myositis, unspecified   . Neurogenic bladder, NOS   . Other abnormal glucose   . Internal hemorrhoids without mention of complication   . Unspecified venous (peripheral) insufficiency   . Seizures     PAST SURGICAL HISTORY: Past Surgical History  Procedure Laterality Date  . Temporomandibular joint surgery    . Vaginal hysterectomy  1996    endometriosis  ovaries removed.  . Breast enhancement surgery    . Interstim implant placement      FAMILY HISTORY: Family History  Problem Relation Age of Onset  . Asthma Mother   . Hyperlipidemia Father   . Parkinsonism Father   . Cancer Sister     skin, fingers, spread to bone  . Stroke      SOCIAL HISTORY:  History   Social History  . Marital Status: Married    Spouse Name: N/A    Number of Children: 2  . Years of Education: 12   Occupational History  . unemployed     not looking for work since 2008   Social History Main Topics  . Smoking status: Current Every Day Smoker -- 1.00 packs/day for 30 years    Types: Cigarettes  . Smokeless  tobacco: Never Used  . Alcohol Use: No     Comment: recovering alcoholic 2009  . Drug Use: No  . Sexual Activity: Not on file   Other Topics Concern  . Not on file   Social History Narrative   Always uses seat belts. Smoke alarm and carbon monoxide detectors in the home. Guns in the home,stored in locked cabinet.  Pets: dog. Married x 4 years Third marriage Happily married, no abuse. Exercise: Does not exercise daily works out in the yard. Lives with spouse and mother in law.Well balanced diet, caffeine use Coffee. 3 servings/day redbull trying to stop. Living Will pat does not have living will, DOES have HCPOA. Organ Donor:YES.     PHYSICAL EXAM   Filed Vitals:   12/01/12 1544  BP: 117/72  Pulse: 82  Height: 5\' 7"  (1.702 m)  Weight: 178 lb (80.74 kg)   Body mass index is  27.87 kg/(m^2).   Generalized: In no acute distress  Neck: Supple, no carotid bruits   Cardiac: Regular rate rhythm  Pulmonary: Clear to auscultation bilaterally  Musculoskeletal: No deformity  Neurological examination  Mentation: Alert oriented to time, place, history taking, and causual conversation  Cranial nerve II-XII: Pupils were equal round reactive to light extraocular movements were full, visual field were full on confrontational test. facial sensation and strength were normal. hearing was intact to finger rubbing bilaterally. Uvula tongue midline.  head turning and shoulder shrug and were normal and symmetric.Tongue protrusion into cheek strength was normal.  Motor: normal tone, bulk and strength.  Sensory: Intact to fine touch, pinprick, preserved vibratory sensation, and proprioception at toes.  Coordination: Normal finger to nose, heel-to-shin bilaterally there was no truncal ataxia  Gait: Rising up from from seated position by pushing on chair arm, cautious, mildly unsteady, wide based, she has difficulty perform tiptoe, heel and tandem walking.  Deep tendon reflexes: Brachioradialis  2/2, biceps 2/2, triceps 2/2, patellar 2/2, Achilles 2/2, plantar responses were flexor bilaterally.   DIAGNOSTIC DATA (LABS, IMAGING, TESTING) - I reviewed patient records, labs, notes, testing and imaging myself where available.  Lab Results  Component Value Date   WBC 9.1 10/27/2012   HGB 12.6 10/27/2012   HCT 37.1 10/27/2012   MCV 87.7 10/27/2012   PLT 229 10/27/2012      Component Value Date/Time   NA 142 10/27/2012 1010   K 3.6 10/27/2012 1010   CL 106 10/27/2012 1010   CO2 27 10/27/2012 1010   GLUCOSE 65* 10/27/2012 1010   BUN 8 10/27/2012 1010   CREATININE 0.78 10/27/2012 1010   CALCIUM 9.2 10/27/2012 1010   PROT 6.2 10/27/2012 1010   ALBUMIN 4.1 10/27/2012 1010   AST 21 10/27/2012 1010   ALT 15 10/27/2012 1010   ALKPHOS 79 10/27/2012 1010   BILITOT 0.3 10/27/2012 1010   Lab Results  Component Value Date   CHOL 154 10/27/2012   HDL 46 10/27/2012   LDLCALC 75 10/27/2012   TRIG 163* 10/27/2012   CHOLHDL 3.3 10/27/2012   Lab Results  Component Value Date   HGBA1C 6.0* 10/27/2012   Lab Results  Component Value Date   VITAMINB12 972* 10/27/2012   Lab Results  Component Value Date   TSH 0.547 10/27/2012     ASSESSMENT AND PLAN   52 years old Caucasian female, with constellation of complaints, including urinary retention, requiring self catheter, improved after that it Interstim implantation, she is no longer MRI candidate, continue have mild gait difficulty, but extensive evaluation previously failed to demonstrate etiology, she is to continue followup with her urologist, call office for new issues, continue moderate exercise,  I refill her medications today, including baclofen one and half tablets 3 times a day, hydrocodone 5/325 one tablet twice a day, Lyrica 150 mg 4 times daily, she continued to complain of bilateral lower extremity deep achy pain, diffuse body aching pain, I will refer her to pain management        Levert Feinstein, M.D.  Ph.D.  [facility] [address] [place], [place] [address] [phone]

## 2012-12-23 ENCOUNTER — Telehealth: Payer: Self-pay | Admitting: Neurology

## 2012-12-24 MED ORDER — HYDROCODONE-ACETAMINOPHEN 5-325 MG PO TABS: ORAL_TABLET | ORAL | Status: DC

## 2012-12-24 NOTE — Telephone Encounter (Deleted)
Patient sent an e-mail requesting a refill on Norco.  She said she needs it before the 25th.  Last OV says she will be referred to a pain clinic.  Would you like to refill?  Please advise.  Thank you.

## 2012-12-24 NOTE — Telephone Encounter (Signed)
Christie Benjamin:  Please check on her refer to pain management.   Shanda Bumps I will refilled her Narco for this month

## 2012-12-24 NOTE — Telephone Encounter (Signed)
Patient would like a refill on Norco.  Last OV says she would be referred to a pain clinic.  I spoke with the patient, she states she has not gotten any info regarding referral.  Okay to refill?  Please advise.  Thank you.

## 2012-12-26 ENCOUNTER — Telehealth: Payer: Self-pay

## 2012-12-26 NOTE — Telephone Encounter (Signed)
Rx faxed

## 2012-12-30 ENCOUNTER — Telehealth: Payer: Self-pay | Admitting: Neurology

## 2012-12-30 NOTE — Telephone Encounter (Signed)
By viewing the chart, it appears Dr Terrace Arabia approved this Rx on 12/17.  I called the office and it is not at the front desk.  Will send message to follow up on Rx.

## 2012-12-30 NOTE — Telephone Encounter (Signed)
I called [facility] and apologized for faxing Rx that needed to be picked up. She will have her husband come and get it.

## 2013-02-02 ENCOUNTER — Ambulatory Visit: Payer: Managed Care, Other (non HMO) | Admitting: Family Medicine

## 2013-02-06 ENCOUNTER — Other Ambulatory Visit: Payer: Self-pay | Admitting: Neurology

## 2013-02-06 MED ORDER — HYDROCODONE-ACETAMINOPHEN 5-325 MG PO TABS: ORAL_TABLET | ORAL | Status: DC

## 2013-02-09 ENCOUNTER — Telehealth: Payer: Self-pay | Admitting: Neurology

## 2013-02-09 NOTE — Telephone Encounter (Signed)
According to chart notes, Rx was written on Friday.  It is likely pending a signature.  We will call the patient when the Rx is ready for pick up.  I called the patient back.  She is aware we will call her when the Rx is ready for pick up.

## 2013-02-09 NOTE — Telephone Encounter (Signed)
NEEDS WRITTEN RX FOR HYDROCODONE  °

## 2013-03-04 ENCOUNTER — Telehealth: Payer: Self-pay | Admitting: Neurology

## 2013-03-04 NOTE — Telephone Encounter (Signed)
Error pt wanted a pain RX but is not due yet I informed her that it can not be given for her to hold she has to wait until she is due for it

## 2013-03-06 ENCOUNTER — Other Ambulatory Visit: Payer: Self-pay | Admitting: *Deleted

## 2013-03-06 MED ORDER — HYDROCODONE-ACETAMINOPHEN 5-325 MG PO TABS: ORAL_TABLET | ORAL | Status: DC

## 2013-03-06 NOTE — Telephone Encounter (Signed)
Pt called to ask if her sister could pick up the prescription as she works nearby and they live near [place] and its hard for her to get here to pick it up.  Thank you

## 2013-03-09 ENCOUNTER — Encounter: Payer: Self-pay | Admitting: Neurology

## 2013-03-09 NOTE — Telephone Encounter (Signed)
Called patient to inform her about her Rx being ready to be picked up at the front desk and if she has any other problems, questions or concerns to call the office. Patient verbalized understanding.

## 2013-03-16 ENCOUNTER — Ambulatory Visit: Payer: Managed Care, Other (non HMO) | Admitting: Family Medicine

## 2013-04-13 ENCOUNTER — Other Ambulatory Visit: Payer: Self-pay | Admitting: Neurology

## 2013-04-13 MED ORDER — HYDROCODONE-ACETAMINOPHEN 5-325 MG PO TABS: ORAL_TABLET | ORAL | Status: DC

## 2013-04-13 NOTE — Telephone Encounter (Signed)
Patient needs written Rx for Hydrocodone--please call when ready for pickup--thank you. °

## 2013-04-13 NOTE — Telephone Encounter (Signed)
Dr Yan is out of the office,  Forwarding request to [facility] for approval.  

## 2013-04-14 NOTE — Telephone Encounter (Signed)
Called pt and left message informing her that her Rx was ready to be picked up at the front desk and if she has any other problems, questions or concerns to call the office.  °

## 2013-05-11 ENCOUNTER — Other Ambulatory Visit: Payer: Self-pay | Admitting: Neurology

## 2013-05-11 MED ORDER — HYDROCODONE-ACETAMINOPHEN 5-325 MG PO TABS: ORAL_TABLET | ORAL | Status: DC

## 2013-05-11 NOTE — Telephone Encounter (Signed)
Request forwarded to provider for approval  

## 2013-05-11 NOTE — Telephone Encounter (Signed)
Pt calling to get refill for HYDROcodone-acetaminophen (NORCO/VICODIN) 5-325 MG per tablet.  Thanks

## 2013-05-12 ENCOUNTER — Other Ambulatory Visit: Payer: Self-pay | Admitting: Family Medicine

## 2013-05-13 NOTE — Telephone Encounter (Signed)
Called in Rx and [facility] for pt w/f/up message.

## 2013-05-13 NOTE — Telephone Encounter (Signed)
Please call in refill of Ambien CR. Also please call patient and advise that she is overdue for six month follow-up appointment. She will need follow-up for further refills of Ambien.

## 2013-05-21 ENCOUNTER — Encounter: Payer: Self-pay | Admitting: Family Medicine

## 2013-05-27 ENCOUNTER — Ambulatory Visit (INDEPENDENT_AMBULATORY_CARE_PROVIDER_SITE_OTHER): Payer: Managed Care, Other (non HMO) | Admitting: Family Medicine

## 2013-05-27 ENCOUNTER — Encounter: Payer: Self-pay | Admitting: Family Medicine

## 2013-05-27 VITALS — BP 116/66 | HR 64 | Temp 98.2°F | Resp 16 | Ht 67.0 in | Wt 179.2 lb

## 2013-05-27 DIAGNOSIS — R7309 Other abnormal glucose: Secondary | ICD-10-CM

## 2013-05-27 DIAGNOSIS — G4700 Insomnia, unspecified: Secondary | ICD-10-CM | POA: Insufficient documentation

## 2013-05-27 DIAGNOSIS — F418 Other specified anxiety disorders: Secondary | ICD-10-CM

## 2013-05-27 DIAGNOSIS — IMO0001 Reserved for inherently not codable concepts without codable children: Secondary | ICD-10-CM

## 2013-05-27 DIAGNOSIS — E7800 Pure hypercholesterolemia, unspecified: Secondary | ICD-10-CM

## 2013-05-27 DIAGNOSIS — R350 Frequency of micturition: Secondary | ICD-10-CM

## 2013-05-27 DIAGNOSIS — M791 Myalgia, unspecified site: Secondary | ICD-10-CM

## 2013-05-27 DIAGNOSIS — M6281 Muscle weakness (generalized): Secondary | ICD-10-CM

## 2013-05-27 LAB — COMPLETE METABOLIC PANEL WITH GFR
ALK PHOS: 111 U/L (ref 39–117)
ALT: 14 U/L (ref 0–35)
AST: 19 U/L (ref 0–37)
Albumin: 4.8 g/dL (ref 3.5–5.2)
BILIRUBIN TOTAL: 0.4 mg/dL (ref 0.2–1.2)
BUN: 8 mg/dL (ref 6–23)
CO2: 27 mEq/L (ref 19–32)
Calcium: 10 mg/dL (ref 8.4–10.5)
Chloride: 103 mEq/L (ref 96–112)
Creat: 0.7 mg/dL (ref 0.50–1.10)
GFR, Est African American: >89 mL/min
Glucose, Bld: 67 mg/dL — ABNORMAL LOW (ref 70–99)
Potassium: 4.2 mEq/L (ref 3.5–5.3)
SODIUM: 141 meq/L (ref 135–145)
Total Protein: 7.3 g/dL (ref 6.0–8.3)

## 2013-05-27 LAB — CBC WITH DIFFERENTIAL/PLATELET
BASOS PCT: 0 % (ref 0–1)
Basophils Absolute: 0 10*3/uL (ref 0.0–0.1)
Eosinophils Absolute: 0.2 10*3/uL (ref 0.0–0.7)
Eosinophils Relative: 2 % (ref 0–5)
HCT: 40.6 % (ref 36.0–46.0)
HEMOGLOBIN: 13.7 g/dL (ref 12.0–15.0)
Lymphocytes Relative: 36 % (ref 12–46)
Lymphs Abs: 3 10*3/uL (ref 0.7–4.0)
MCH: 29.8 pg (ref 26.0–34.0)
MCHC: 33.7 g/dL (ref 30.0–36.0)
MCV: 88.3 fL (ref 78.0–100.0)
MONOS PCT: 6 % (ref 3–12)
Monocytes Absolute: 0.5 10*3/uL (ref 0.1–1.0)
NEUTROS ABS: 4.7 10*3/uL (ref 1.7–7.7)
Neutrophils Relative %: 56 % (ref 43–77)
Platelets: 266 10*3/uL (ref 150–400)
RBC: 4.6 MIL/uL (ref 3.87–5.11)
RDW: 13.7 % (ref 11.5–15.5)
WBC: 8.4 10*3/uL (ref 4.0–10.5)

## 2013-05-27 LAB — POCT URINALYSIS DIPSTICK
BILIRUBIN UA: NEGATIVE
Glucose, UA: NEGATIVE
Ketones, UA: NEGATIVE
NITRITE UA: NEGATIVE
Protein, UA: NEGATIVE
Spec Grav, UA: 1.01
Urobilinogen, UA: 0.2
pH, UA: 7.5

## 2013-05-27 LAB — POCT UA - MICROSCOPIC ONLY
Casts, Ur, LPF, POC: NEGATIVE
Crystals, Ur, HPF, POC: NEGATIVE
Mucus, UA: NEGATIVE
Yeast, UA: NEGATIVE

## 2013-05-27 LAB — LIPID PANEL
Cholesterol: 165 mg/dL (ref 0–200)
HDL: 51 mg/dL (ref >39)
LDL Cholesterol: 77 mg/dL (ref 0–99)
Total CHOL/HDL Ratio: 3.2 Ratio
Triglycerides: 184 mg/dL — ABNORMAL HIGH (ref <150)
VLDL: 37 mg/dL (ref 0–40)

## 2013-05-27 MED ORDER — ZOLPIDEM TARTRATE ER 12.5 MG PO TBCR: 12.5000 mg | EXTENDED_RELEASE_TABLET | Freq: Every day | ORAL | Status: DC

## 2013-05-27 NOTE — Progress Notes (Signed)
Patient ID: Christie Benjamin, female   DOB: [birth year], 53 y.o.   MRN: [id number]   This chart was scribed for Wardell Honour, MD by Eston Mould, ED Scribe. This patient was seen in room Room/bed 28 and the patient's care was started at 3:16 PM. Subjective:    Patient ID: Christie Benjamin, female    DOB: [birth year], 53 y.o.   MRN: [id number]  05/27/2013  Medication Refill  HPI Christie Benjamin is a 52 y.o. female who presents to the [facility] for medication refill. Pt states she recently had her bladder stimulator removed and placed back on her R side. Reports having her grandmother pass away the day after her surgery. States she has been overall well. Pt states she has been emotionally well. States she is sleeping well and her family is doing well. States she has been urinating like normal. Reports having intermittent seasonal allergies but states this is bareable. Reports having normal BM and states she generally take Senakot with relief. Pt states she has been having some "urinary dribbling" the past few days and suspects this is due to UTI. Denies having a mammogram done. Denies CP.  Pt states she has been released by Dr. Jim Like at [facility] on 02/17/13 with MRI brain that WNL. Pt is being sent to a pain management clinic in [facility] for July 2015. She states Dr. Erling Cruz mentioned fibromyalgia. Denies seeing rheumatology. Reports having myalgias and arthralgias, currently worse in her R arm.  States she is taking Effexor 1 x a bedtime instead of 3; denies having complications. Is taking .5 of Ambien at bedtime; denies any complications. Pt has begun taking Amantadine.5 in the morning and .5 in the evening; reports having relief with medication.   Review of Systems  Constitutional: Negative for fever, chills, diaphoresis and fatigue.  HENT: Positive for congestion and sneezing.   Eyes: Negative for visual disturbance.  Respiratory: Negative for cough and shortness of  breath.   Cardiovascular: Negative for chest pain, palpitations and leg swelling.  Gastrointestinal: Negative for nausea, vomiting, abdominal pain, diarrhea and constipation.  Endocrine: Negative for cold intolerance, heat intolerance, polydipsia, polyphagia and polyuria.  Musculoskeletal: Positive for myalgias.  Neurological: Negative for dizziness, tremors, seizures, syncope, facial asymmetry, speech difficulty, weakness, light-headedness, numbness and headaches.  Psychiatric/Behavioral: Positive for sleep disturbance. Negative for dysphoric mood and decreased concentration. The patient is not nervous/anxious.   All other systems reviewed and are negative.   Past Medical History  Diagnosis Date  . GERD (gastroesophageal reflux disease)   . Pain in joint, shoulder region   . Neuralgia, neuritis, and radiculitis, unspecified   . Diverticulosis of colon (without mention of hemorrhage)   . Disturbance of skin sensation   . Tobacco use disorder   . Personal history of alcoholism   . Depressive disorder, not elsewhere classified   . Pure hypercholesterolemia   . Allergic rhinitis, cause unspecified   . Myalgia and myositis, unspecified   . Neurogenic bladder, NOS   . Other abnormal glucose   . Internal hemorrhoids without mention of complication   . Unspecified venous (peripheral) insufficiency   . Seizures    Allergies  Allergen Reactions  . Septra Ds [Sulfamethoxazole-Trimethoprim] Hives   Current Outpatient Prescriptions  Medication Sig Dispense Refill  . amantadine (SYMMETREL) 100 MG capsule Take 50 mg by mouth 2 (two) times daily.    Marland Kitchen aspirin 81 MG tablet Take 81 mg by mouth daily.    . baclofen (LIORESAL) 10 MG  tablet Take 1.5 tablets (15 mg total) by mouth 3 (three) times daily. 140 tablet 5  . Casanthranol-Docusate Sodium 30-100 MG CAPS Take 100 mg by mouth daily.    . cholecalciferol (VITAMIN D) 1000 UNITS tablet Take 1,000 Units by mouth daily.    . fluticasone  (FLONASE) 50 MCG/ACT nasal spray Place 2 sprays into the nose daily. 16 g 11  . furosemide (LASIX) 20 MG tablet Take 1 tablet (20 mg total) by mouth daily. 30 tablet 11  . ibuprofen (ADVIL,MOTRIN) 800 MG tablet Take 1 tablet (800 mg total) by mouth every 8 (eight) hours as needed. 40 tablet 3  . KLOR-CON M20 20 MEQ tablet     . omeprazole (PRILOSEC) 20 MG capsule Take 20 mg by mouth 2 (two) times daily.    . potassium chloride (KLOR-CON) 20 MEQ packet Take 20 mEq by mouth daily. 30 tablet 11  . venlafaxine XR (EFFEXOR-XR) 75 MG 24 hr capsule 3 tablets daily 90 capsule 11  . zolpidem (AMBIEN CR) 12.5 MG CR tablet Take 1 tablet (12.5 mg total) by mouth at bedtime. 30 tablet 5  . HYDROcodone-acetaminophen (NORCO/VICODIN) 5-325 MG per tablet TAKE 1 TABLET BY MOUTH TWICE A DAY 60 tablet 0  . LYRICA 150 MG capsule TAKE ONE CAPSULE BY MOUTH 4 TIMES A DAY 120 capsule 1  . simvastatin (ZOCOR) 40 MG tablet TAKE 1 TABLET (40 MG TOTAL) BY MOUTH AT BEDTIME. 30 tablet 0   No current facility-administered medications for this visit.   Objective:  Triage Vitals:BP 116/66  Pulse 64  Temp(Src) 98.2 F (36.8 C) (Oral)  Resp 16  Ht $R'5\' 7"'ep$  (1.702 m)  Wt 179 lb 3.2 oz (81.285 kg)  BMI 28.06 kg/m2  SpO2 98%  Physical Exam  Constitutional: She is oriented to person, place, and time. She appears well-developed and well-nourished. No distress.  HENT:  Head: Normocephalic and atraumatic.  Right Ear: External ear normal.  Left Ear: External ear normal.  Nose: Nose normal.  Mouth/Throat: Oropharynx is clear and moist.  Eyes: Conjunctivae and EOM are normal. Pupils are equal, round, and reactive to light.  Neck: Normal range of motion. Neck supple. Carotid bruit is not present. No tracheal deviation present. No thyromegaly present.  Cardiovascular: Normal rate, regular rhythm, normal heart sounds and intact distal pulses.  Exam reveals no gallop and no friction rub.   No murmur heard. Pulmonary/Chest: Effort  normal and breath sounds normal. No respiratory distress. She has no wheezes. She has no rales.  Abdominal: Soft. Bowel sounds are normal. She exhibits no distension and no mass. There is no tenderness. There is no rebound and no guarding.  Musculoskeletal: Normal range of motion.  Lymphadenopathy:    She has no cervical adenopathy.  Neurological: She is alert and oriented to person, place, and time. No cranial nerve deficit.  Skin: Skin is warm and dry. No rash noted. She is not diaphoretic. No erythema. No pallor.  Psychiatric: She has a normal mood and affect. Her behavior is normal.  Nursing note and vitals reviewed.  Results for orders placed or performed in visit on 05/27/13  Urine culture  Result Value Ref Range   Colony Count NO GROWTH    Organism ID, Bacteria NO GROWTH   CBC with Differential  Result Value Ref Range   WBC 8.4 4.0 - 10.5 K/uL   RBC 4.60 3.87 - 5.11 MIL/uL   Hemoglobin 13.7 12.0 - 15.0 g/dL   HCT 40.6 36.0 - 46.0 %  MCV 88.3 78.0 - 100.0 fL   MCH 29.8 26.0 - 34.0 pg   MCHC 33.7 30.0 - 36.0 g/dL   RDW 13.7 11.5 - 15.5 %   Platelets 266 150 - 400 K/uL   Neutrophils Relative % 56 43 - 77 %   Neutro Abs 4.7 1.7 - 7.7 K/uL   Lymphocytes Relative 36 12 - 46 %   Lymphs Abs 3.0 0.7 - 4.0 K/uL   Monocytes Relative 6 3 - 12 %   Monocytes Absolute 0.5 0.1 - 1.0 K/uL   Eosinophils Relative 2 0 - 5 %   Eosinophils Absolute 0.2 0.0 - 0.7 K/uL   Basophils Relative 0 0 - 1 %   Basophils Absolute 0.0 0.0 - 0.1 K/uL   Smear Review Criteria for review not met   COMPLETE METABOLIC PANEL WITH GFR  Result Value Ref Range   Sodium 141 135 - 145 mEq/L   Potassium 4.2 3.5 - 5.3 mEq/L   Chloride 103 96 - 112 mEq/L   CO2 27 19 - 32 mEq/L   Glucose, Bld 67 (L) 70 - 99 mg/dL   BUN 8 6 - 23 mg/dL   Creat 0.70 0.50 - 1.10 mg/dL   Total Bilirubin 0.4 0.2 - 1.2 mg/dL   Alkaline Phosphatase 111 39 - 117 U/L   AST 19 0 - 37 U/L   ALT 14 0 - 35 U/L   Total Protein 7.3 6.0 - 8.3  g/dL   Albumin 4.8 3.5 - 5.2 g/dL   Calcium 10.0 8.4 - 10.5 mg/dL   GFR, Est African American >89 mL/min   GFR, Est Non African American >89 mL/min  Lipid panel  Result Value Ref Range   Cholesterol 165 0 - 200 mg/dL   Triglycerides 184 (H) <150 mg/dL   HDL 51 >39 mg/dL   Total CHOL/HDL Ratio 3.2 Ratio   VLDL 37 0 - 40 mg/dL   LDL Cholesterol 77 0 - 99 mg/dL  Hemoglobin A1c  Result Value Ref Range   Hgb A1c MFr Bld 5.6 <5.7 %   Mean Plasma Glucose 114 <117 mg/dL  POCT urinalysis dipstick  Result Value Ref Range   Color, UA clear    Clarity, UA clear    Glucose, UA neg    Bilirubin, UA neg    Ketones, UA neg    Spec Grav, UA 1.010    Blood, UA trace-lysed    pH, UA 7.5    Protein, UA neg    Urobilinogen, UA 0.2    Nitrite, UA neg    Leukocytes, UA Trace   POCT UA - Microscopic Only  Result Value Ref Range   WBC, Ur, HPF, POC 0-2    RBC, urine, microscopic 0-1    Bacteria, U Microscopic trace    Mucus, UA neg    Epithelial cells, urine per micros 0-3    Crystals, Ur, HPF, POC neg    Casts, Ur, LPF, POC neg    Yeast, UA neg    Assessment & Plan:  Urinary frequency - Plan: POCT urinalysis dipstick, POCT UA - Microscopic Only, Urine culture  Pure hypercholesterolemia - Plan: Lipid panel  Other abnormal glucose - Plan: CBC with Differential, COMPLETE METABOLIC PANEL WITH GFR, Hemoglobin A1c  Myalgia - Plan: Ambulatory referral to Rheumatology  Depression with anxiety  Muscle weakness  Insomnia   1.  Urinary frequency: New. Send urine culture. 2.  Hypercholesterolemia: controlled; obtain labs; continue medication. 3.  Glucose intolerance: stable; obtain labs;  recommend weight loss, exercise, low-sugar food choices. 4.  Myalgias: persistent; refer to rheumatology to rule out autoimmune process. 5. Depression with anxiety: stable on Effexor; no change to management; follow-up in six months. 6. Muscle weakness: persistent; stable.   7.  Insomnia: well-controlled  with Ambien CR.    Meds ordered this encounter  Medications  . zolpidem (AMBIEN CR) 12.5 MG CR tablet    Sig: Take 1 tablet (12.5 mg total) by mouth at bedtime.    Dispense:  30 tablet    Refill:  5  . amantadine (SYMMETREL) 100 MG capsule    Sig: Take 50 mg by mouth 2 (two) times daily.    Return in about 6 months (around 11/27/2013) for complete physical examiniation.  I personally performed the services described in this documentation, which was scribed in my presence.  The recorded information has been reviewed and is accurate.  Reginia Forts, M.D.  [facility] [address] [place], [place]  [address] [phone] phone [phone] fax

## 2013-05-28 LAB — HEMOGLOBIN A1C
HEMOGLOBIN A1C: 5.6 % (ref <5.7)
Mean Plasma Glucose: 114 mg/dL (ref <117)

## 2013-05-29 ENCOUNTER — Encounter: Payer: Self-pay | Admitting: Family Medicine

## 2013-05-29 LAB — URINE CULTURE
Colony Count: NO GROWTH
ORGANISM ID, BACTERIA: NO GROWTH

## 2013-06-02 ENCOUNTER — Encounter: Payer: Self-pay | Admitting: Family Medicine

## 2013-06-08 DIAGNOSIS — M797 Fibromyalgia: Secondary | ICD-10-CM

## 2013-06-08 HISTORY — DX: Fibromyalgia: M79.7

## 2013-06-11 ENCOUNTER — Other Ambulatory Visit: Payer: Self-pay | Admitting: *Deleted

## 2013-06-11 MED ORDER — HYDROCODONE-ACETAMINOPHEN 5-325 MG PO TABS: ORAL_TABLET | ORAL | Status: DC

## 2013-06-11 NOTE — Telephone Encounter (Signed)
Request forwarded to provider for approval  

## 2013-06-12 NOTE — Telephone Encounter (Signed)
Pt's husband came by the office to pick up pt's Rx today.

## 2013-07-08 ENCOUNTER — Other Ambulatory Visit: Payer: Self-pay | Admitting: *Deleted

## 2013-07-08 MED ORDER — HYDROCODONE-ACETAMINOPHEN 5-325 MG PO TABS: ORAL_TABLET | ORAL | Status: DC

## 2013-07-08 NOTE — Telephone Encounter (Signed)
Request forwarded to provider for approval  

## 2013-07-10 ENCOUNTER — Ambulatory Visit: Payer: Self-pay | Admitting: Physician Assistant

## 2013-07-10 LAB — COMPREHENSIVE METABOLIC PANEL
ALBUMIN: 4 g/dL (ref 3.4–5.0)
ALK PHOS: 104 U/L
ALT: 23 U/L (ref 12–78)
Anion Gap: 8 (ref 7–16)
BUN: 7 mg/dL (ref 7–18)
Bilirubin,Total: 0.1 mg/dL — ABNORMAL LOW (ref 0.2–1.0)
CALCIUM: 9.1 mg/dL (ref 8.5–10.1)
CHLORIDE: 100 mmol/L (ref 98–107)
CO2: 30 mmol/L (ref 21–32)
CREATININE: 0.84 mg/dL (ref 0.60–1.30)
EGFR (Non-African Amer.): >60
Glucose: 93 mg/dL (ref 65–99)
Osmolality: 273 (ref 275–301)
Potassium: 3.5 mmol/L (ref 3.5–5.1)
SGOT(AST): 15 U/L (ref 15–37)
Sodium: 138 mmol/L (ref 136–145)
Total Protein: 7.4 g/dL (ref 6.4–8.2)

## 2013-07-10 LAB — URINALYSIS, COMPLETE
BILIRUBIN, UR: NEGATIVE
Bacteria: NEGATIVE
Blood: NEGATIVE
Glucose,UR: NEGATIVE mg/dL (ref 0–75)
KETONE: NEGATIVE
Leukocyte Esterase: NEGATIVE
NITRITE: NEGATIVE
Ph: 8 (ref 4.5–8.0)
Protein: NEGATIVE
Specific Gravity: 1.005 (ref 1.003–1.030)
Squamous Epithelial: NONE SEEN

## 2013-07-10 LAB — CBC WITH DIFFERENTIAL/PLATELET
Basophil #: 0 10*3/uL (ref 0.0–0.1)
Basophil %: 0.4 %
EOS ABS: 0.1 10*3/uL (ref 0.0–0.7)
Eosinophil %: 1.3 %
HCT: 39.2 % (ref 35.0–47.0)
HGB: 13.3 g/dL (ref 12.0–16.0)
LYMPHS ABS: 4.6 10*3/uL — AB (ref 1.0–3.6)
LYMPHS PCT: 43.6 %
MCH: 30.8 pg (ref 26.0–34.0)
MCHC: 33.9 g/dL (ref 32.0–36.0)
MCV: 91 fL (ref 80–100)
Monocyte #: 0.5 x10 3/mm (ref 0.2–0.9)
Monocyte %: 4.8 %
Neutrophil #: 5.3 10*3/uL (ref 1.4–6.5)
Neutrophil %: 49.9 %
PLATELETS: 227 10*3/uL (ref 150–440)
RBC: 4.32 10*6/uL (ref 3.80–5.20)
RDW: 13.2 % (ref 11.5–14.5)
WBC: 10.7 10*3/uL (ref 3.6–11.0)

## 2013-08-06 ENCOUNTER — Telehealth: Payer: Self-pay | Admitting: Neurology

## 2013-08-06 NOTE — Telephone Encounter (Signed)
Patient did go to pain clinic last week, however they did not prescribe medication.  They would like her to go to rheumatologist.  She is requesting a refill on Hydrocodone.

## 2013-08-06 NOTE — Telephone Encounter (Signed)
Patient returning call and stated saw pain management in [place] last week.  Pain Management didn't want to change medication, wanted her to see Rheumatologist.

## 2013-08-06 NOTE — Telephone Encounter (Signed)
Patient requesting Rx refill for HYDROcodone-acetaminophen (NORCO/VICODIN) 5-325 MG per tablet.  Please call anytime when ready for pickup, if not available can leave message on vm..Thanks

## 2013-08-10 NOTE — Telephone Encounter (Signed)
I called and spoke with the patient.  She says she will see the rheumatologist in Sept.  Advised a message was sent to the provider regarding Rx.

## 2013-08-10 NOTE — Telephone Encounter (Signed)
Patient calling to check whether her hydrocodone script is available for pick up yet, it was not up front, please return call to patient and advise.

## 2013-08-12 ENCOUNTER — Encounter: Payer: Self-pay | Admitting: Neurology

## 2013-08-13 NOTE — Telephone Encounter (Signed)
Jessica: Please call patient, since her last office visit in November 2014, she has been referred to pain management, our office will no longer prescribe the narcotics for her, she should contact her next treating physician, primary care physician for continued refill of her medications

## 2013-08-13 NOTE — Telephone Encounter (Signed)
Patient was seen by pain clinic, but they would not provide a Rx.  They have referred her to Rheumatologist, which she will see next month.  She is asking for another refill on Hydrocodone until she can be seen there.  Please advise.  Thank you.

## 2013-08-13 NOTE — Telephone Encounter (Signed)
Patient calling to check on whether her hydrocodone script was ready for pick up, told patient that we are still waiting on the provider and patient is frustrated and wants an explanation why it's taking so long. Please return call and advise.

## 2013-08-13 NOTE — Telephone Encounter (Signed)
I called the patient.  Got no answer.  Left message relaying Dr Rhea Belton note.

## 2013-08-14 ENCOUNTER — Encounter: Payer: Self-pay | Admitting: Neurology

## 2013-08-14 ENCOUNTER — Encounter: Payer: Self-pay | Admitting: Family Medicine

## 2013-08-14 MED ORDER — HYDROCODONE-ACETAMINOPHEN 5-325 MG PO TABS: ORAL_TABLET | ORAL | Status: DC

## 2013-08-14 NOTE — Telephone Encounter (Signed)
I have reviewed chart, she was last seen in our office in Nov 2014, initiate the pain management refer then.  Per record, she has already been evaluated by her pain management,recenlty, pending rheumatologist refer, she has no further following up with [place].    Christie Benjamin, let her know    We will refill her medications, hydrocodone/Tylenol 5/325 bid 60 tabs each month, till Nov 2015, with August, Sept, Oct, Last Rx from our office will be Nov 2015.   We will not refill her medications afterwards.

## 2013-08-14 NOTE — Telephone Encounter (Signed)
I called the patient back.  Relayed Dr Rhea Belton message.  She  Verbalized understanding and was agreeable to this plan.  She will call us back if anything further is needed.

## 2013-09-09 ENCOUNTER — Other Ambulatory Visit: Payer: Self-pay | Admitting: Neurology

## 2013-09-09 MED ORDER — HYDROCODONE-ACETAMINOPHEN 5-325 MG PO TABS: ORAL_TABLET | ORAL | Status: DC

## 2013-09-09 NOTE — Telephone Encounter (Signed)
Patient calling for Rx refill of HYDROcodone-acetaminophen (NORCO/VICODIN) 5-325 MG per tablet.  Please call anytime when ready for pick up and may leave detailed message if not available.

## 2013-09-11 NOTE — Telephone Encounter (Signed)
Pt called and was informed that her Rx was ready to be picked up at the front desk and if she has any other problems, questions or concerns to call the office. Pt verbalized understanding.

## 2013-10-05 ENCOUNTER — Telehealth: Payer: Self-pay | Admitting: *Deleted

## 2013-10-05 ENCOUNTER — Other Ambulatory Visit: Payer: Self-pay | Admitting: Neurology

## 2013-10-05 NOTE — Telephone Encounter (Signed)
Rx is a refill too soon.  I called back.  Got no answer.  Left message.

## 2013-10-08 ENCOUNTER — Other Ambulatory Visit: Payer: Self-pay

## 2013-10-08 ENCOUNTER — Encounter: Payer: Self-pay | Admitting: Neurology

## 2013-10-08 NOTE — Telephone Encounter (Signed)
Patient calling to see if Rx is ready for pick up, please advise.

## 2013-10-08 NOTE — Telephone Encounter (Signed)
I called and spoke with the patient.  She is aware we will call her once the Rx has been signed by the provider.

## 2013-10-09 ENCOUNTER — Encounter: Payer: Self-pay | Admitting: Neurology

## 2013-10-09 MED ORDER — HYDROCODONE-ACETAMINOPHEN 5-325 MG PO TABS: ORAL_TABLET | ORAL | Status: AC

## 2013-10-12 NOTE — Telephone Encounter (Signed)
Called pt to inform her that her Rx was ready to be picked up at the front desk and if she has any other problems, questions or concerns to call the office. Pt verbalized understanding. °

## 2013-10-12 NOTE — Telephone Encounter (Signed)
Patient checking if script is ready yet, please call and advise.

## 2013-10-12 NOTE — Telephone Encounter (Signed)
Tye Maryland has already called patient to advise Rx is ready at front desk (please see other encounter)

## 2013-10-23 ENCOUNTER — Other Ambulatory Visit: Payer: Self-pay

## 2013-10-27 DIAGNOSIS — G894 Chronic pain syndrome: Secondary | ICD-10-CM | POA: Insufficient documentation

## 2013-10-27 DIAGNOSIS — Z0289 Encounter for other administrative examinations: Secondary | ICD-10-CM | POA: Insufficient documentation

## 2013-10-27 DIAGNOSIS — Z79899 Other long term (current) drug therapy: Secondary | ICD-10-CM | POA: Insufficient documentation

## 2013-11-01 ENCOUNTER — Other Ambulatory Visit: Payer: Self-pay | Admitting: Family Medicine

## 2013-11-23 ENCOUNTER — Encounter: Payer: Self-pay | Admitting: Family Medicine

## 2013-11-23 ENCOUNTER — Ambulatory Visit (INDEPENDENT_AMBULATORY_CARE_PROVIDER_SITE_OTHER): Payer: Managed Care, Other (non HMO) | Admitting: Family Medicine

## 2013-11-23 VITALS — BP 112/78 | HR 75 | Temp 98.0°F | Resp 16 | Ht 67.0 in | Wt 176.0 lb

## 2013-11-23 DIAGNOSIS — F418 Other specified anxiety disorders: Secondary | ICD-10-CM

## 2013-11-23 DIAGNOSIS — Z6827 Body mass index (BMI) 27.0-27.9, adult: Secondary | ICD-10-CM

## 2013-11-23 DIAGNOSIS — F419 Anxiety disorder, unspecified: Secondary | ICD-10-CM

## 2013-11-23 DIAGNOSIS — F32A Depression, unspecified: Secondary | ICD-10-CM

## 2013-11-23 DIAGNOSIS — R7302 Impaired glucose tolerance (oral): Secondary | ICD-10-CM

## 2013-11-23 DIAGNOSIS — F329 Major depressive disorder, single episode, unspecified: Secondary | ICD-10-CM

## 2013-11-23 DIAGNOSIS — Z0000 Encounter for general adult medical examination without abnormal findings: Secondary | ICD-10-CM

## 2013-11-23 DIAGNOSIS — E7800 Pure hypercholesterolemia, unspecified: Secondary | ICD-10-CM

## 2013-11-23 DIAGNOSIS — Z720 Tobacco use: Secondary | ICD-10-CM

## 2013-11-23 DIAGNOSIS — G4700 Insomnia, unspecified: Secondary | ICD-10-CM

## 2013-11-23 DIAGNOSIS — I878 Other specified disorders of veins: Secondary | ICD-10-CM

## 2013-11-23 LAB — LIPID PANEL
CHOL/HDL RATIO: 2.9 ratio
Cholesterol: 144 mg/dL (ref 0–200)
HDL: 49 mg/dL (ref >39)
LDL CALC: 71 mg/dL (ref 0–99)
TRIGLYCERIDES: 122 mg/dL (ref <150)
VLDL: 24 mg/dL (ref 0–40)

## 2013-11-23 LAB — CBC WITH DIFFERENTIAL/PLATELET
BASOS ABS: 0 10*3/uL (ref 0.0–0.1)
BASOS PCT: 0 % (ref 0–1)
EOS ABS: 0.2 10*3/uL (ref 0.0–0.7)
EOS PCT: 2 % (ref 0–5)
HCT: 36.7 % (ref 36.0–46.0)
Hemoglobin: 12.8 g/dL (ref 12.0–15.0)
Lymphocytes Relative: 46 % (ref 12–46)
Lymphs Abs: 3.7 10*3/uL (ref 0.7–4.0)
MCH: 30.3 pg (ref 26.0–34.0)
MCHC: 34.9 g/dL (ref 30.0–36.0)
MCV: 86.8 fL (ref 78.0–100.0)
MPV: 10.4 fL (ref 9.4–12.4)
Monocytes Absolute: 0.5 10*3/uL (ref 0.1–1.0)
Monocytes Relative: 6 % (ref 3–12)
NEUTROS PCT: 46 % (ref 43–77)
Neutro Abs: 3.7 10*3/uL (ref 1.7–7.7)
PLATELETS: 234 10*3/uL (ref 150–400)
RBC: 4.23 MIL/uL (ref 3.87–5.11)
RDW: 13.5 % (ref 11.5–15.5)
WBC: 8.1 10*3/uL (ref 4.0–10.5)

## 2013-11-23 LAB — POCT URINALYSIS DIPSTICK
BILIRUBIN UA: NEGATIVE
Glucose, UA: NEGATIVE
Ketones, UA: NEGATIVE
NITRITE UA: NEGATIVE
PH UA: 8.5
Spec Grav, UA: 1.015
UROBILINOGEN UA: 0.2

## 2013-11-23 LAB — COMPREHENSIVE METABOLIC PANEL
ALT: 17 U/L (ref 0–35)
AST: 22 U/L (ref 0–37)
Albumin: 4.1 g/dL (ref 3.5–5.2)
Alkaline Phosphatase: 94 U/L (ref 39–117)
BILIRUBIN TOTAL: 0.4 mg/dL (ref 0.2–1.2)
BUN: 9 mg/dL (ref 6–23)
CO2: 28 mEq/L (ref 19–32)
CREATININE: 0.85 mg/dL (ref 0.50–1.10)
Calcium: 9.3 mg/dL (ref 8.4–10.5)
Chloride: 104 mEq/L (ref 96–112)
Glucose, Bld: 52 mg/dL — ABNORMAL LOW (ref 70–99)
Potassium: 4 mEq/L (ref 3.5–5.3)
SODIUM: 139 meq/L (ref 135–145)
Total Protein: 6.7 g/dL (ref 6.0–8.3)

## 2013-11-23 LAB — TSH: TSH: 0.723 u[IU]/mL (ref 0.350–4.500)

## 2013-11-23 MED ORDER — ZOLPIDEM TARTRATE ER 12.5 MG PO TBCR: 12.5000 mg | EXTENDED_RELEASE_TABLET | Freq: Every day | ORAL | Status: DC

## 2013-11-23 MED ORDER — FUROSEMIDE 20 MG PO TABS: 20.0000 mg | ORAL_TABLET | Freq: Every day | ORAL | Status: DC

## 2013-11-23 MED ORDER — POTASSIUM CHLORIDE 20 MEQ PO PACK: 20.0000 meq | PACK | Freq: Every day | ORAL | Status: DC

## 2013-11-23 MED ORDER — VENLAFAXINE HCL ER 75 MG PO CP24: ORAL_CAPSULE | ORAL | Status: DC

## 2013-11-23 MED ORDER — SIMVASTATIN 40 MG PO TABS: ORAL_TABLET | ORAL | Status: DC

## 2013-11-23 MED ORDER — FLUOXETINE HCL 20 MG PO TABS: 20.0000 mg | ORAL_TABLET | Freq: Every day | ORAL | Status: DC

## 2013-11-23 NOTE — Patient Instructions (Signed)

## 2013-11-23 NOTE — Progress Notes (Signed)
Subjective:    Patient ID: Christie Benjamin, female    DOB: 1960-05-22, 53 y.o.   MRN: [id number]  11/23/2013  Annual Exam   HPI This 53 y.o. female presents for Complete Physical Examination.  Last physical:  10/27/2012 Pap smear:  Hysterectomy; refusing pap smear and pelvic exam today. Mammogram:  2013; did not go for mammogram last year. Declines mammogram this year. Colonoscopy:  2010; [facility].  Repeat in 5 years. TDAP:  2010 Pneumovax: declined in 2014. Influenza: refuses Eye exam:  +glasses; scheduled this week; [facility]. Dental exam:  Never.   Anxiety and depression:  Six month follow-up.  Daughter left husband so that has been very stressful. Then daughter was in horrible MVA.  Daughter is pregnant.  Irritable.  Ugly.  Onset of irritability and short-tempered 3+ months ago.  Sent pain specialist a message due to worsening anxiety.  Pain specialist recommended follow-up with MD who prescribed medication.  Compliance with Effexor 225mg  daily.  Did not contact psychiatrist who evaluated patient; psychiatrist is on maternity leave.  Previous Cymbalta use; suffered with side effect.  Zoloft in past; took for a long time; suffered with weight gain with Zoloft.  Increased appetite with stress.    Trial of Lexapro without improvement.  Picking at face.    Myalgias:  : s/p EMG on L side; very worried about pain with EMG.  EMG was 11/10/2013; does not know results; follow-up on 01/06/14.  Thinks the technician did say polyneuropatlhy.  S/p rheumatology consultation; diagnosed with fibromyalgia.   Labs were normal.  Mentioned worry about statin induced myopathy.  Did not hold statin.    Chronic pan syndrome: prescribing Hydrocodone bid.  S/p neurology consultation at [facility]; started Amantadine 100mg  bid.  Wean Baclofen; currently taking one qhs.  Changed Lyrica to one bid.     Review of Systems  Constitutional: Negative for fever, chills, diaphoresis, activity change, appetite change,  fatigue and unexpected weight change.  HENT: Negative for congestion, dental problem, drooling, ear discharge, ear pain, facial swelling, hearing loss, mouth sores, nosebleeds, postnasal drip, rhinorrhea, sinus pressure, sneezing, sore throat, tinnitus, trouble swallowing and voice change.   Eyes: Negative for photophobia, pain, discharge, redness, itching and visual disturbance.  Respiratory: Negative for apnea, cough, choking, chest tightness, shortness of breath, wheezing and stridor.   Cardiovascular: Negative for chest pain, palpitations and leg swelling.  Gastrointestinal: Negative for nausea, vomiting, abdominal pain, diarrhea, constipation, blood in stool, abdominal distention, anal bleeding and rectal pain.  Endocrine: Negative for cold intolerance, heat intolerance, polydipsia, polyphagia and polyuria.  Genitourinary: Negative for dysuria, urgency, frequency, hematuria, flank pain, decreased urine volume, vaginal bleeding, vaginal discharge, enuresis, difficulty urinating, genital sores, vaginal pain, menstrual problem, pelvic pain and dyspareunia.  Musculoskeletal: Positive for myalgias and arthralgias. Negative for back pain, joint swelling, gait problem, neck pain and neck stiffness.  Skin: Negative for color change, pallor, rash and wound.  Allergic/Immunologic: Negative for environmental allergies, food allergies and immunocompromised state.  Neurological: Negative for dizziness, tremors, seizures, syncope, facial asymmetry, speech difficulty, weakness, light-headedness, numbness and headaches.  Hematological: Negative for adenopathy. Does not bruise/bleed easily.  Psychiatric/Behavioral: Positive for sleep disturbance and dysphoric mood. Negative for suicidal ideas, hallucinations, behavioral problems, confusion, self-injury, decreased concentration and agitation. The patient is nervous/anxious. The patient is not hyperactive.     Past Medical History  Diagnosis Date  . GERD  (gastroesophageal reflux disease)   . Pain in joint, shoulder region   . Neuralgia, neuritis, and radiculitis,  unspecified   . Diverticulosis of colon (without mention of hemorrhage)   . Disturbance of skin sensation   . Tobacco use disorder   . Personal history of alcoholism   . Depressive disorder, not elsewhere classified   . Pure hypercholesterolemia   . Allergic rhinitis, cause unspecified   . Myalgia and myositis, unspecified   . Neurogenic bladder, NOS   . Other abnormal glucose   . Internal hemorrhoids without mention of complication   . Unspecified venous (peripheral) insufficiency   . Seizures   . Fibromyalgia 06/08/2013    rheumatology consultation [facility].   Past Surgical History  Procedure Laterality Date  . Temporomandibular joint surgery    . Breast enhancement surgery    . Interstim implant placement    . Vaginal hysterectomy  1996    endometriosis;  ovaries removed.   Allergies  Allergen Reactions  . Septra Ds [Sulfamethoxazole-Trimethoprim] Hives   Current Outpatient Prescriptions  Medication Sig Dispense Refill  . amantadine (SYMMETREL) 100 MG capsule Take 50 mg by mouth 2 (two) times daily.    . baclofen (LIORESAL) 10 MG tablet Take 1.5 tablets (15 mg total) by mouth 3 (three) times daily. 140 tablet 5  . Casanthranol-Docusate Sodium 30-100 MG CAPS Take 100 mg by mouth daily.    . fluticasone (FLONASE) 50 MCG/ACT nasal spray Place 2 sprays into the nose daily. 16 g 11  . furosemide (LASIX) 20 MG tablet Take 1 tablet (20 mg total) by mouth daily. 30 tablet 11  . HYDROcodone-acetaminophen (NORCO/VICODIN) 5-325 MG per tablet TAKE 1 TABLET BY MOUTH TWICE A DAY 60 tablet 0  . ibuprofen (ADVIL,MOTRIN) 800 MG tablet Take 1 tablet (800 mg total) by mouth every 8 (eight) hours as needed. 40 tablet 3  . KLOR-CON M20 20 MEQ tablet     . LYRICA 150 MG capsule TAKE ONE CAPSULE BY MOUTH 4 TIMES A DAY 120 capsule 1  . omeprazole (PRILOSEC) 20 MG capsule Take 20 mg by mouth 2  (two) times daily.    . simvastatin (ZOCOR) 40 MG tablet TAKE 1 TABLET (40 MG TOTAL) BY MOUTH AT BEDTIME. 30 tablet 11  . venlafaxine XR (EFFEXOR-XR) 75 MG 24 hr capsule 2 tablets daily 60 capsule 5  . zolpidem (AMBIEN CR) 12.5 MG CR tablet Take 1 tablet (12.5 mg total) by mouth at bedtime. 30 tablet 5  . aspirin 81 MG tablet Take 81 mg by mouth daily.    . cholecalciferol (VITAMIN D) 1000 UNITS tablet Take 1,000 Units by mouth daily.    Marland Kitchen FLUoxetine (PROZAC) 20 MG tablet Take 1 tablet (20 mg total) by mouth daily. 30 tablet 3  . potassium chloride (KLOR-CON) 20 MEQ packet Take 20 mEq by mouth daily. 30 tablet 11   No current facility-administered medications for this visit.       Objective:    BP 112/78 mmHg  Pulse 75  Temp(Src) 98 F (36.7 C) (Oral)  Resp 16  Ht 5\' 7"  (1.702 m)  Wt 176 lb (79.833 kg)  BMI 27.56 kg/m2  SpO2 98% Physical Exam  Constitutional: She is oriented to person, place, and time. She appears well-developed and well-nourished. No distress.  HENT:  Head: Normocephalic and atraumatic.  Right Ear: External ear normal.  Left Ear: External ear normal.  Nose: Nose normal.  Mouth/Throat: Oropharynx is clear and moist.  Eyes: Conjunctivae and EOM are normal. Pupils are equal, round, and reactive to light.  Neck: Normal range of motion and full passive range  of motion without pain. Neck supple. No JVD present. Carotid bruit is not present. No thyromegaly present.  Cardiovascular: Normal rate, regular rhythm and normal heart sounds.  Exam reveals no gallop and no friction rub.   No murmur heard. Pulmonary/Chest: Effort normal and breath sounds normal. She has no wheezes. She has no rales. Right breast exhibits no inverted nipple, no mass, no nipple discharge, no skin change and no tenderness. Left breast exhibits no inverted nipple, no mass, no nipple discharge, no skin change and no tenderness. Breasts are symmetrical.  Abdominal: Soft. Bowel sounds are normal. She  exhibits no distension and no mass. There is no tenderness. There is no rebound and no guarding.  Musculoskeletal:       Right shoulder: Normal.       Left shoulder: Normal.       Cervical back: Normal.  Lymphadenopathy:    She has no cervical adenopathy.  Neurological: She is alert and oriented to person, place, and time. She has normal reflexes. No cranial nerve deficit. She exhibits normal muscle tone. Coordination normal.  Skin: Skin is warm and dry. No rash noted. She is not diaphoretic. No erythema. No pallor.  Diffuse sun related changes on torso and extremities.  Psychiatric: Her speech is normal and behavior is normal. Judgment and thought content normal. Her mood appears anxious. Cognition and memory are normal.  Nursing note and vitals reviewed.       Assessment & Plan:   1. Routine general medical examination at a health care facility   2. Pure hypercholesterolemia   3. Glucose intolerance (impaired glucose tolerance)   4. Anxiety and depression   5. Insomnia   6. Depression with anxiety   7. BMI 27.0-27.9,adult   8. Tobacco abuse       1. Complete Physical Examination:  Anticipatory guidance --- weight loss, exercise, smoking cessation.  No longer warrants pap smears.  Non-compliant with mammogram; s/p breast exam in office; refuses mammogram this year.  Colonoscopy UTD; due for repeat.  Pt declined Pneumovax and influenza vaccines.   2.  Hypercholesterolemia: controlled; obtain labs; refill provided of Zocor 40mg  daily. 3.  Glucose intolerance: stable; recent weight loss; obtain labs. Recommend weight loss, exercise, dietary modification. 4.  Anxiety and depression: worsening; wean Effexor to two tablets daily; start Prozac 20mg  daily.  Will likely need to increase Prozac dose and decrease Effexor. 5.  Insomnia: controlled; refill of Ambien CR provided. 6.  Overweight with BMI of 27: encourage further weight loss. 7.  Tobacco abuse: precontemplative; encourage  cessation. 8. Venous stasis: stable; refill of Lasix provided. 9. Fibromyalgia:  Newly diagnosed; s/p rheumatology consultation and pain clinic consultation.     Meds ordered this encounter  Medications  . zolpidem (AMBIEN CR) 12.5 MG CR tablet    Sig: Take 1 tablet (12.5 mg total) by mouth at bedtime.    Dispense:  30 tablet    Refill:  5  . venlafaxine XR (EFFEXOR-XR) 75 MG 24 hr capsule    Sig: 2 tablets daily    Dispense:  60 capsule    Refill:  5  . FLUoxetine (PROZAC) 20 MG tablet    Sig: Take 1 tablet (20 mg total) by mouth daily.    Dispense:  30 tablet    Refill:  3  . furosemide (LASIX) 20 MG tablet    Sig: Take 1 tablet (20 mg total) by mouth daily.    Dispense:  30 tablet    Refill:  11  .  simvastatin (ZOCOR) 40 MG tablet    Sig: TAKE 1 TABLET (40 MG TOTAL) BY MOUTH AT BEDTIME.    Dispense:  30 tablet    Refill:  11  . potassium chloride (KLOR-CON) 20 MEQ packet    Sig: Take 20 mEq by mouth daily.    Dispense:  30 tablet    Refill:  11    Return in about 6 weeks (around 01/04/2014) for recheck anxiety and depression.    Reginia Forts, M.D.  [facility] [address] [place], [place]  [address] [phone] phone [phone] fax

## 2013-11-24 LAB — HEMOGLOBIN A1C
Hgb A1c MFr Bld: 5.7 % — ABNORMAL HIGH (ref <5.7)
Mean Plasma Glucose: 117 mg/dL — ABNORMAL HIGH (ref <117)

## 2013-11-30 ENCOUNTER — Encounter: Payer: Self-pay | Admitting: Family Medicine

## 2013-12-01 ENCOUNTER — Encounter: Payer: Self-pay | Admitting: Family Medicine

## 2013-12-01 DIAGNOSIS — Z6827 Body mass index (BMI) 27.0-27.9, adult: Secondary | ICD-10-CM | POA: Insufficient documentation

## 2013-12-01 DIAGNOSIS — Z720 Tobacco use: Secondary | ICD-10-CM | POA: Insufficient documentation

## 2013-12-02 ENCOUNTER — Encounter: Payer: Self-pay | Admitting: Family Medicine

## 2013-12-07 ENCOUNTER — Encounter: Payer: Self-pay | Admitting: Family Medicine

## 2014-01-04 ENCOUNTER — Ambulatory Visit (INDEPENDENT_AMBULATORY_CARE_PROVIDER_SITE_OTHER): Payer: Managed Care, Other (non HMO) | Admitting: Family Medicine

## 2014-01-04 ENCOUNTER — Encounter: Payer: Self-pay | Admitting: Family Medicine

## 2014-01-04 VITALS — BP 122/80 | HR 79 | Temp 98.1°F | Resp 16 | Ht 67.0 in | Wt 169.8 lb

## 2014-01-04 DIAGNOSIS — F329 Major depressive disorder, single episode, unspecified: Secondary | ICD-10-CM

## 2014-01-04 DIAGNOSIS — M797 Fibromyalgia: Secondary | ICD-10-CM

## 2014-01-04 DIAGNOSIS — F32A Depression, unspecified: Secondary | ICD-10-CM

## 2014-01-04 DIAGNOSIS — G894 Chronic pain syndrome: Secondary | ICD-10-CM

## 2014-01-04 DIAGNOSIS — F419 Anxiety disorder, unspecified: Principal | ICD-10-CM

## 2014-01-04 DIAGNOSIS — G4700 Insomnia, unspecified: Secondary | ICD-10-CM

## 2014-01-04 DIAGNOSIS — F418 Other specified anxiety disorders: Secondary | ICD-10-CM

## 2014-01-04 MED ORDER — FLUOXETINE HCL 20 MG PO TABS: 40.0000 mg | ORAL_TABLET | Freq: Every day | ORAL | Status: DC

## 2014-01-04 NOTE — Progress Notes (Signed)
Subjective:    Patient ID: Christie Benjamin, female    DOB: 04-02-1960, 53 y.o.   MRN: [id number]  HPI Patient presents for six week medication f/u for Prozac for anxiety/depression. Does not feel like medication is working at this time and may need an increase. Agitation and anxiety not better or worse from previous appt. Has side effects of headache for past 2 weeks and waking at 3 am for 1 week. Is additionally raiding fridge when waking at night. Thought headaches were from sinus infection since weather has been changing, but didn't have sinus pressure, congestion, or cough. Main stressor is taking care of mother-in-law without any help. Sister-in-law lives close, however, does not contribute which frustrates her further. Moved in with her to better manage, but did not change stress level. Says stress level is 10/10 currently. Is anxious, but says mood is stable. Never completely stopped Effexor.  Taking Effexor 75mg  one daily and Prozac 20mg  one daily.  Denies SI/HI.  Very anxious. Has lost weight and feels like appetite decreased; feels like decreased appetite is due to stress.  TSH at last visit normal.  Still taking Ambien as prescribed. Taking 1/2 qhs.    Does not want to quit smoking at this time. Wants to quit at some point, but needs to get stress level under control first.  Has follow up this week with pain management for fibromyalgia.  S/p neurology consultation in [place] in past year; no neurological process identified.  S/p rheumatology consultation in the past year who diagnosed pt with fibromyalgia.  Rheumatology released patient but referred to pain management. Has undergone three visits thus far with pain management.  Has suffered with worsening pain in past month thus has been using hydrocodone tid on some days; now out of hydrocodone early. Started on Amantadine six months ago for fibromyalgia related fatigue by [facility] neurology; now fatigue improved but anxiety has  worsened in past 3-6 months.  S/p EMG on 11/10/13; has not heard about results; review of Care Everywhere shows a normal EMG.  S/p psychological evaluation (Dr. Johnsie Kindred) via pain management and determined appropriate for chronic opioid usage.  Maintained on Lyrica and planning to wean baclofen.  Has not been able to attend aquatic therapy due to worsening stressors.  S/p neuroophthalmology evaluation in 02/2013; no evidence of optic neuritis.  S/p repeat MRI brain 04/2013 with no evidence of demyelinating disease; no evidence of MS.   Review of Systems  Constitutional: Negative for fever, chills, diaphoresis, appetite change, fatigue and unexpected weight change.  HENT: Negative for congestion and sinus pressure.   Respiratory: Negative for shortness of breath.   Cardiovascular: Negative for chest pain, palpitations and leg swelling.  Gastrointestinal: Positive for constipation.  Neurological: Positive for headaches. Negative for dizziness, tremors, seizures, syncope, facial asymmetry, speech difficulty and light-headedness.  Psychiatric/Behavioral: Positive for sleep disturbance and agitation. Negative for suicidal ideas, hallucinations, behavioral problems, confusion, self-injury, dysphoric mood and decreased concentration. The patient is nervous/anxious. The patient is not hyperactive.        Objective:   Physical Exam  Constitutional: She is oriented to person, place, and time. She appears well-developed and well-nourished. No distress.  Blood pressure 122/80, pulse 79, temperature 98.1 F (36.7 C), temperature source Oral, resp. rate 16, height 5\' 7"  (1.702 m), weight 169 lb 12.8 oz (77.021 kg), SpO2 98 %.  HENT:  Head: Normocephalic and atraumatic.  Right Ear: External ear normal.  Left Ear: External ear normal.  Nose: Nose  normal.  Mouth/Throat: Oropharynx is clear and moist.  Eyes: Conjunctivae and EOM are normal. Pupils are equal, round, and reactive to light. Right eye exhibits no  discharge. Left eye exhibits no discharge. No scleral icterus.  Neck: Normal range of motion. Neck supple.  Cardiovascular: Normal rate, regular rhythm and normal heart sounds.  Exam reveals no gallop and no friction rub.   No murmur heard. Pulmonary/Chest: Effort normal and breath sounds normal. She has no wheezes. She has no rales.  Abdominal: Soft. Bowel sounds are normal. She exhibits no distension and no mass. There is no tenderness.  Neurological: She is alert and oriented to person, place, and time. No cranial nerve deficit. Coordination normal.  Skin: Skin is warm and dry. No rash noted. She is not diaphoretic. No erythema. No pallor.  Psychiatric: Judgment and thought content normal. Her mood appears anxious. She is hyperactive.       Assessment & Plan:  1. Anxiety and depression Not improving. Increase Prozac to 40mg  daily and will f/u in 6 weeks. Wean off of Effexor over next week.  Concern for Amantadine contributing to worsening anxiety. If no improvement in anxiety at next visit, will recommend stopping/weaning Amantadine.  Recommend psychotherapy to continue with [facility]. - FLUoxetine (PROZAC) 20 MG tablet; Take 2 tablets (40 mg total) by mouth daily.  Dispense: 60 tablet; Refill: 3  2. Insomnia Worsening with worsening anxiety; continue Ambien CR.  Amantadine may also be contributing to insomnia.  3. Fibromyalgia Worsening in past month due to worsening stressors; follow-up with pain management [facility] this week.  Maintained on Lyrica, hydrocodone, baclofen (weaning), and Amantadine.  4. Chronic pain syndrome Worsening in past month; followed by pain management at [facility].   Reginia Forts, MD [facility] 01/04/2014 3:46 PM

## 2014-01-04 NOTE — Patient Instructions (Signed)
1. INCREASE PROZAC TO 20MG  TWO TABLETS DAILY. 2.  DECREASE EFFEXOR TO ONE TABLET EVERY OTHER DAY FOR ONE WEEK AND THEN STOP. 3.  DISCUSS SEEING THERAPIST/PSYCHIATRIST WITH PAIN MANAGEMENT PHYSICIAN ON Wednesday.   SEND MESSAGE TO DR. Payne.

## 2014-01-05 ENCOUNTER — Encounter: Payer: Self-pay | Admitting: Family Medicine

## 2014-01-06 ENCOUNTER — Encounter: Payer: Self-pay | Admitting: Family Medicine

## 2014-01-26 ENCOUNTER — Encounter: Payer: Self-pay | Admitting: Family Medicine

## 2014-01-27 ENCOUNTER — Encounter: Payer: Self-pay | Admitting: Family Medicine

## 2014-01-28 ENCOUNTER — Other Ambulatory Visit: Payer: Self-pay | Admitting: Family Medicine

## 2014-01-28 DIAGNOSIS — F329 Major depressive disorder, single episode, unspecified: Secondary | ICD-10-CM

## 2014-01-28 DIAGNOSIS — F419 Anxiety disorder, unspecified: Principal | ICD-10-CM

## 2014-02-01 ENCOUNTER — Encounter: Payer: Self-pay | Admitting: Family Medicine

## 2014-02-15 ENCOUNTER — Ambulatory Visit: Payer: Managed Care, Other (non HMO) | Admitting: Family Medicine

## 2014-03-05 ENCOUNTER — Other Ambulatory Visit: Payer: Self-pay | Admitting: Family Medicine

## 2014-03-07 NOTE — Telephone Encounter (Signed)
Dr Tamala Julian, you saw pt for check up in Dec, but don't see this med discussed recently. Ok to RF?

## 2014-04-14 ENCOUNTER — Encounter: Payer: Self-pay | Admitting: Family Medicine

## 2014-04-14 ENCOUNTER — Telehealth: Payer: Self-pay

## 2014-04-14 ENCOUNTER — Ambulatory Visit (INDEPENDENT_AMBULATORY_CARE_PROVIDER_SITE_OTHER): Payer: Managed Care, Other (non HMO) | Admitting: Family Medicine

## 2014-04-14 VITALS — BP 110/64 | HR 70 | Temp 98.9°F | Resp 16 | Ht 67.5 in | Wt 177.2 lb

## 2014-04-14 DIAGNOSIS — E785 Hyperlipidemia, unspecified: Secondary | ICD-10-CM | POA: Diagnosis not present

## 2014-04-14 DIAGNOSIS — M797 Fibromyalgia: Secondary | ICD-10-CM

## 2014-04-14 DIAGNOSIS — R7302 Impaired glucose tolerance (oral): Secondary | ICD-10-CM

## 2014-04-14 DIAGNOSIS — R350 Frequency of micturition: Secondary | ICD-10-CM

## 2014-04-14 DIAGNOSIS — F418 Other specified anxiety disorders: Secondary | ICD-10-CM | POA: Diagnosis not present

## 2014-04-14 DIAGNOSIS — F329 Major depressive disorder, single episode, unspecified: Secondary | ICD-10-CM

## 2014-04-14 DIAGNOSIS — F419 Anxiety disorder, unspecified: Secondary | ICD-10-CM

## 2014-04-14 LAB — COMPREHENSIVE METABOLIC PANEL
ALBUMIN: 4.3 g/dL (ref 3.5–5.2)
ALK PHOS: 74 U/L (ref 39–117)
ALT: 14 U/L (ref 0–35)
AST: 18 U/L (ref 0–37)
BUN: 8 mg/dL (ref 6–23)
CHLORIDE: 104 meq/L (ref 96–112)
CO2: 26 mEq/L (ref 19–32)
Calcium: 9.1 mg/dL (ref 8.4–10.5)
Creat: 0.72 mg/dL (ref 0.50–1.10)
GLUCOSE: 63 mg/dL — AB (ref 70–99)
Potassium: 3.4 mEq/L — ABNORMAL LOW (ref 3.5–5.3)
Sodium: 141 mEq/L (ref 135–145)
TOTAL PROTEIN: 6.6 g/dL (ref 6.0–8.3)
Total Bilirubin: 0.4 mg/dL (ref 0.2–1.2)

## 2014-04-14 LAB — POCT URINALYSIS DIPSTICK
Bilirubin, UA: NEGATIVE
Glucose, UA: NEGATIVE
Ketones, UA: NEGATIVE
LEUKOCYTES UA: NEGATIVE
NITRITE UA: NEGATIVE
PH UA: 6.5
PROTEIN UA: NEGATIVE
Spec Grav, UA: <=1.005
UROBILINOGEN UA: 0.2

## 2014-04-14 LAB — CBC WITH DIFFERENTIAL/PLATELET
BASOS ABS: 0 10*3/uL (ref 0.0–0.1)
BASOS PCT: 0 % (ref 0–1)
EOS ABS: 0.1 10*3/uL (ref 0.0–0.7)
Eosinophils Relative: 1 % (ref 0–5)
HEMATOCRIT: 37.2 % (ref 36.0–46.0)
Hemoglobin: 12.7 g/dL (ref 12.0–15.0)
Lymphocytes Relative: 44 % (ref 12–46)
Lymphs Abs: 4.9 10*3/uL — ABNORMAL HIGH (ref 0.7–4.0)
MCH: 30.5 pg (ref 26.0–34.0)
MCHC: 34.1 g/dL (ref 30.0–36.0)
MCV: 89.4 fL (ref 78.0–100.0)
MONO ABS: 0.8 10*3/uL (ref 0.1–1.0)
MONOS PCT: 7 % (ref 3–12)
MPV: 10.8 fL (ref 8.6–12.4)
Neutro Abs: 5.3 10*3/uL (ref 1.7–7.7)
Neutrophils Relative %: 48 % (ref 43–77)
PLATELETS: 255 10*3/uL (ref 150–400)
RBC: 4.16 MIL/uL (ref 3.87–5.11)
RDW: 14.3 % (ref 11.5–15.5)
WBC: 11.1 10*3/uL — AB (ref 4.0–10.5)

## 2014-04-14 LAB — HEMOGLOBIN A1C
HEMOGLOBIN A1C: 5.8 % — AB (ref <5.7)
Mean Plasma Glucose: 120 mg/dL — ABNORMAL HIGH (ref <117)

## 2014-04-14 LAB — LIPID PANEL
CHOLESTEROL: 163 mg/dL (ref 0–200)
HDL: 47 mg/dL (ref >=46)
LDL Cholesterol: 95 mg/dL (ref 0–99)
TRIGLYCERIDES: 104 mg/dL (ref <150)
Total CHOL/HDL Ratio: 3.5 Ratio
VLDL: 21 mg/dL (ref 0–40)

## 2014-04-14 LAB — POCT UA - MICROSCOPIC ONLY
Bacteria, U Microscopic: NEGATIVE
CRYSTALS, UR, HPF, POC: NEGATIVE
Casts, Ur, LPF, POC: NEGATIVE
Epithelial cells, urine per micros: NEGATIVE
Mucus, UA: NEGATIVE
WBC, UR, HPF, POC: NEGATIVE
Yeast, UA: NEGATIVE

## 2014-04-14 LAB — VITAMIN B12: Vitamin B-12: 1136 pg/mL — ABNORMAL HIGH (ref 211–911)

## 2014-04-14 LAB — TSH: TSH: 0.724 u[IU]/mL (ref 0.350–4.500)

## 2014-04-14 MED ORDER — FLUOXETINE HCL 20 MG PO TABS: 60.0000 mg | ORAL_TABLET | Freq: Every day | ORAL | Status: DC

## 2014-04-14 NOTE — Progress Notes (Signed)
Subjective:    Patient ID: Christie Benjamin, female    DOB: 1960/11/01, 54 y.o.   MRN: [id number]  HPI Christie Benjamin is a 54 year old Caucasian female here today for follow up of anxiety and depression, as well as potential UTI.  She is doing well since she was last seen here in 12/2013. At the last visit, increased  Prozac from 20 mg daily to 40 mg daily, and she seems to be doing much better with the dose increase. She also weaned off the Effexor and Amantadine and has since felt much better after stopping these medications. She still notes nervousness on occasion, but the anxiety and depression has greatly improved. Her home situation has remained the same, but she is doing better handling her stress and avoiding trigger situations. She has no suicidal ideations or homicidal ideations. Suffering with persistent anxiety daily but much improved from last visit.  Her insomnia has been well controlled with Ambien 12.5 mg  1/2 qhs. She reports sleeping well through the night and waking up rested.   Over the last 4 days, she says that she feels more tired than usual. She is worried about a UTI. She has increased frequency and dysuria, without hematuria.   She continues to smoke about 1 ppd, but has recently supplemented E-cigarettes. She has not cut back on the number of cigarettes, but is working her way towards cutting back.  She has been taking Allegra over the counter for allergies, which seems to help. Felt that she might have been in a "pollen fog" the last couple of days, which could have contributed to her tiredness.   She continues to be followed by pain management at [facility] for fibromyalgia.  Suffering with recurrent hand swelling B; pain specialist may have pt return to rheumatology; recently prescribed Voltaren gel without improvement. Taking Hydrocodone bid most days; she will sometimes need it tid.   Due for repeat FLP; Patient reports good compliance with medication, good  tolerance to medication, and good symptom control.  Denies HA/dizziness/focal weakness/paresthesias. Continues to smoke; would like to lose weight but would like a weight loss shot.   Review of Systems  Constitutional: Negative for fever, chills and fatigue.  HENT: Positive for congestion, rhinorrhea and sneezing. Negative for ear pain, hearing loss, postnasal drip, sinus pressure and sore throat.   Eyes: Negative for pain, itching and visual disturbance.  Respiratory: Negative for cough and shortness of breath.   Cardiovascular: Positive for palpitations (Associated with nervousness.). Negative for chest pain.  Gastrointestinal: Negative for nausea, vomiting, abdominal pain, diarrhea, constipation and blood in stool.  Genitourinary: Positive for dysuria, urgency and frequency. Negative for hematuria and flank pain.  Musculoskeletal: Positive for myalgias and arthralgias.       Regularly sees pain management for control of pain.  Neurological: Negative for light-headedness and headaches.  Psychiatric/Behavioral: Negative for suicidal ideas, sleep disturbance, self-injury and dysphoric mood. The patient is nervous/anxious.        Objective:   Physical Exam  Constitutional: She is oriented to person, place, and time. She appears well-developed and well-nourished. No distress.  HENT:  Head: Normocephalic and atraumatic.  Right Ear: External ear normal.  Left Ear: External ear normal.  Nose: Nose normal.  Mouth/Throat: Oropharynx is clear and moist.  Eyes: Conjunctivae and EOM are normal. Pupils are equal, round, and reactive to light.  Neck: Normal range of motion. Neck supple. Carotid bruit is not present. No thyromegaly present.  Cardiovascular: Normal rate, regular  rhythm, normal heart sounds and intact distal pulses.  Exam reveals no gallop and no friction rub.   No murmur heard. Pulmonary/Chest: Effort normal and breath sounds normal. She has no wheezes. She has no rales.    Abdominal: Soft. Bowel sounds are normal. She exhibits no distension and no mass. There is no tenderness. There is no rebound and no guarding.  Lymphadenopathy:    She has no cervical adenopathy.  Neurological: She is alert and oriented to person, place, and time. No cranial nerve deficit.  Skin: Skin is warm and dry. No rash noted. She is not diaphoretic. No erythema. No pallor.  Psychiatric: Her speech is normal and behavior is normal. Judgment and thought content normal. Her mood appears anxious. Cognition and memory are normal.        Assessment & Plan:   1. Urinary frequency   2. Anxiety and depression   3. Hyperlipidemia   4. Glucose intolerance (impaired glucose tolerance)   5. Fibromyalgia     1. Urinary frequency: New. Send urine culture. 2.  Anxiety and depression: improved with increased dose of Prozac and cessation of Amantadine and Effexor.  Suffering with persistent anxiety throughout the day; thus, will increase Prozac to 60mg  daily.  RTC three months. 3.  Hyperlipidemia: controlled; obtain labs; continue current medications. 4.  Glucose intolerance: stable; obtain labs; recommend weight loss, exercise, low sugar food choices. 5.  Fibromyalgia: persistent; management by pain specialist at [facility]: maintained on Baclofen, hydrocodone, Lyrica, and Voltaren gel.    Meds ordered this encounter  Medications  . diclofenac sodium (VOLTAREN) 1 % GEL    Sig: Apply topically.  Marland Kitchen FLUoxetine (PROZAC) 20 MG tablet    Sig: Take 3 tablets (60 mg total) by mouth daily.    Dispense:  90 tablet    Refill:  5

## 2014-04-14 NOTE — Telephone Encounter (Signed)
Pt's husband called with a concerned about her prescription drug use. He believes that she is misusing/abusing these medications and would like to know if Dr. Tamala Julian can discuss alternatives with her during their office visit today. Will mark as priority due to her OV being today at 2:00

## 2014-04-14 NOTE — Telephone Encounter (Signed)
FYI DR Tamala Julian

## 2014-04-14 NOTE — Patient Instructions (Signed)
Social Anxiety Disorder  Social anxiety disorder, previously called social phobia, is a mental disorder. People with social anxiety disorder frequently feel nervous, afraid, or embarrassed when around other people in social situations. They constantly worry that other people are judging or criticizing them for how they look, what they say, or how they act. They may worry that other people might reject them because of their appearance or behavior.  Social anxiety disorder is more than just occasional shyness or self-consciousness. It can cause severe emotional distress. It can interfere with daily life activities. Social anxiety disorder also may lead to excessive alcohol or drug use and even suicide.   Social anxiety disorder is actually one of the most common mental disorders. It can develop at any time but usually starts in the teenage years. Women are more commonly affected than men. Social anxiety disorder is also more common in people who have family members with anxiety disorders. It also is more common in people who have physical deformities or conditions with characteristics that are obvious to others, such as stuttered speech or movement abnormalities (Parkinson disease).   SYMPTOMS   In addition to feeling anxious or fearful in social situations, people with social anxiety disorder frequently have physical symptoms. Examples include:  · Red face (blushing).  · Racing heart.  · Sweating.  · Shaky hands or voice.  · Confusion.  · Light-headedness.  · Upset stomach and diarrhea.  DIAGNOSIS   Social anxiety disorder is diagnosed through an assessment by your health care provider. Your health care provider will ask you questions about your mood, thoughts, and reactions in social situations. Your health care provider may ask you about your medical history and use of alcohol or drugs, including prescription medicines. Certain medical conditions and the use of certain substances, including caffeine, can cause  symptoms similar to social anxiety disorder. Your health care provider may refer you to a mental health specialist for further evaluation or treatment.  The criteria for diagnosis of social anxiety disorder are:  · Marked fear or anxiety in one or more social situations in which you may be closely watched or studied by others. Examples of such situations include:    Interacting socially (having a conversation with others, going to a party, or meeting strangers).    Being observed (eating or drinking in public or being called on in class).    Performing in front of others (giving a speech).  · The social situations of concern almost always cause fear or anxiety, not just occasionally.  · People with social anxiety disorder fear that they will be viewed negatively in a way that will be embarrassing, will lead to rejection, or will offend others. This fear is out of proportion to the actual threat posed by the social situation.  · Often the triggering social situations are avoided, or they are endured with intense fear or anxiety. The fear, anxiety, or avoidance is persistent and lasts for 6 months or longer.  · The anxiety causes difficulty functioning in at least some parts of your daily life.  TREATMENT   Several types of treatment are available for social anxiety disorder. These treatments are often used in combination and include:   · Talk therapy. Group talk therapy allows you to see that you are not alone with these problems. Individual talk therapy helps you address your specific anxiety issues with a caring professional. The most effective forms of talk therapy for social anxiety disorder are cognitive-behavioral therapy and exposure therapy.   that you fear most.  Relaxation and coping techniques. These include deep  breathing, self-talk, meditation, visual imagery, and yoga. Relaxation techniques help to keep you calm in social situations.  Social [employer] secretary.Social skills can be learned on your own or with the help of a talk therapist. They can help you feel more confident and comfortable in social situations.  Medicine. For anxiety limited to performance situations (performance anxiety), medicine called beta blockers can help by reducing or preventing the physical symptoms of social anxiety disorder. For more persistent and generalized social anxiety, antidepressant medicine may be prescribed to help control symptoms. In severe cases of social anxiety disorder, strong antianxiety medicine, called benzodiazepines, may be prescribed on a limited basis and for a short time. Document Released: 11/23/2004 Document Revised: 05/11/2013 Document Reviewed: 03/25/2012 [facility] Patient Information 2015 [place], [place]. This information is not intended to replace advice given to you by your health care provider. Make sure you discuss any questions you have with your health care provider. Place anxiety patient instructions here.

## 2014-04-15 LAB — VITAMIN D 25 HYDROXY (VIT D DEFICIENCY, FRACTURES): Vit D, 25-Hydroxy: 32 ng/mL (ref 30–100)

## 2014-04-15 NOTE — Telephone Encounter (Signed)
Reviewed and noted prior to visit on 04/14/14.

## 2014-04-16 LAB — URINE CULTURE
COLONY COUNT: NO GROWTH
Organism ID, Bacteria: NO GROWTH

## 2014-05-02 NOTE — Discharge Summary (Signed)
PATIENT NAME:  Christie Benjamin, BOFFA MR#:  [id number] DATE OF BIRTH:  11/30/1960  DATE OF ADMISSION:  01/15/2011 DATE OF DISCHARGE:  01/15/2011  PRIMARY CARE PHYSICIAN: Reginia Forts, MD   FINAL DIAGNOSES:  1. Chest pain which is atypical, possible gastroesophageal reflux disease.  2. Hyperlipidemia.  3. Depression.  4. Neurogenic bladder.   MEDICATIONS ON DISCHARGE:  1. Zoloft 100 mg daily.  2. Lyrica 150 mg 3 times a day.  3. Baclofen 10 mg twice a day. 4. Ambien CR 6.25 mg 2 tablets at bedtime.  5. Omeprazole 20 mg p.o. daily.  6. Nicotine patch 21 mg to chest wall daily.  7. Vicodin as needed for pain every six hours, has at home.   DO NOT TAKE: Do not take Zocor or simvastatin for two weeks; if no change in symptoms may restart.   DISCHARGE INSTRUCTIONS: Do not smoke cigarettes.   ACTIVITY: As tolerated.   DIET: Low sodium diet.   FOLLOW-UP: Follow-up in 1 to 2 weeks with Dr. Reginia Forts.   REASON FOR ADMISSION: The patient was admitted 01/14/2011 and discharged 01/15/2011. The patient was admitted as an observation with chest pain, bilateral arm pain, diaphoresis and nausea.   HISTORY OF PRESENT ILLNESS: The patient is a 54 year old female with hyperlipidemia, depression, tobacco abuse, chronic pain and neuropathy, and neurogenic bladder who presented with chest pain, bilateral arm pain going on for three weeks progressively getting worse. She was also having some nausea and indigestion, pain 7 out of 10 in intensity, feels like a rubber band around the entire chest. She was admitted to telemetry. Serial cardiac enzymes were ordered.   LABORATORY AND RADIOLOGICAL DATA DURING THE HOSPITAL COURSE: EKG showed normal sinus rhythm, 72 beats per minute, no acute ST-T wave changes. Liver function tests normal. Troponin negative. Glucose 92, BUN 6, creatinine 0.77, sodium 141, potassium 3.6, chloride 105, CO2 30, calcium 9.1, white blood cell count 6.5, hemoglobin and hematocrit 12.5  and 37.3, platelet count 217. Chest x-ray no significant abnormalities. Lipase 163. Next two sets of troponins were negative. Hemoglobin A1c 6.0. TSH 2.11. Triglycerides 166, HDL 44, LDL 68. INR, PT normal range. Myoview showed normal left ventricular function, normal wall motion, normal sestamibi scintography without evidence for scar or ischemia.   HOSPITAL COURSE PER PROBLEM LIST:  1. For the patient's chest pain, with the cardiac enzymes this was not a myocardial infarction; with a negative stress test good test for ruling out cardiac ischemia. This was negative. The patient was discharged home. The patient still did have pains, could be possible gastroesophageal reflux disease. Omeprazole was prescribed. Could be musculoskeletal, could also be Zocor related. Advised a trial off of Zocor for two weeks since she has generalized pains throughout her body.  2. Hyperlipidemia. The patient was advised to stop Zocor for two weeks.  3. Depression. Continue Zoloft.  4. Chronic pain, neurogenic bladder. The patient is on Lyrica, baclofen, and Ambien. The patient does follow-up with a neurologist who is working up [place] or not.  5. Tobacco abuse. Smoking cessation counseling done three minutes by me. Nicotine patch prescribed.    TIME SPENT ON DISCHARGE: 35 minutes.   ____________________________ Tana Conch. Leslye Peer, MD rjw:drc D: 01/16/2011 16:37:36 ET T: 01/17/2011 14:01:57 ET JOB#: [id number]  cc: Tana Conch. Leslye Peer, MD, <Dictator>, Renette Butters. Tamala Julian, MD Marisue Brooklyn MD ELECTRONICALLY SIGNED 01/28/2011 13:32

## 2014-05-02 NOTE — H&P (Signed)
PATIENT NAME:  Christie Benjamin, Christie Benjamin MR#:  [id number] DATE OF BIRTH:  May 31, 1960  DATE OF ADMISSION:  01/14/2011  REFERRING PHYSICIAN: Dr. Thomasene Lot PRIMARY CARE PHYSICIAN: Dr. Reginia Forts  PRIMARY NEUROLOGIST: Dr. Erling Cruz in [place]: Chest pain, bilateral arm pain, diaphoresis and nausea.   HISTORY OF PRESENT ILLNESS: Ms. Gunnarson is a 54 year old woman with history of hyperlipidemia, depression, distant alcohol dependence, tobacco abuse, chronic pain and neuropathy, neurogenic bladder, questionable seizure disorder who presents with reports of developing chest pain yesterday. She also reports bilateral arm pain that has been going on for greater than three weeks and has been progressively getting worse this week. Reports that she was at rest when the chest pain occurred. She also had associated diaphoresis and nausea, indigestion. Since then her pain has been coming off and on. She currently has a pain 7/10. Describes it as feeling like her circulation is being cut off. Reports that her pain improves when she lies on her side. She denies any lower extremity swelling. She does endorse some presyncope but no syncope. Denies any similar episodes in the past.   PAST MEDICAL HISTORY:  1. Questionable seizure disorder. Patient is being followed by neurology for her chronic pain and neuropathy as well as history of neurogenic bladder. She potentially has diagnosis of Parkinson's versus MS; however, final diagnosis has not been made.  2. Depression.  3. Distant alcohol dependence.  4. Hyperlipidemia.  5. Chronic pain and neuropathy.  6. Neurogenic bladder, patient performs self in and out catheterization approximately now two times per week which has been reduced.  7. 10/2008 colonoscopy revealing for internal hemorrhoids.    PAST SURGICAL HISTORY:  1. Hysterectomy.  2. Temporomandibular joint.  3. Right cervical lymph node biopsy.  4. Left ganglion cyst resection.     ALLERGIES: Septra.   MEDICATIONS:  1. Vicodin 5/500, 1 tablet b.i.d.  2. Zolpidem ER 6.25 mg 2 tablets at bedtime as needed.  3. Sertraline 100 mg daily.  4. Simvastatin 40 mg daily.  5. Lyrica 150 mg daily.  6. Baclofen 10 mg daily.  7. Aspirin 81 mg daily.   FAMILY HISTORY: Father with Parkinson's, grandfather Parkinson's, history of stroke in her family. Her daughter has diabetes.   SOCIAL HISTORY: She lives in [place] with her husband. Smokes 1 pack per day for over 20 years. She quit alcohol 3 to 4 years ago. No history of drug use. She is unemployed.   REVIEW OF SYSTEMS: CONSTITUTIONAL: No fever. She endorses chills. No weight changes. EYES: No glaucoma, cataracts. ENT: No epistaxis, discharge. RESPIRATORY: No cough, wheezing, hemoptysis. CARDIOVASCULAR: As per history of present illness. GASTROINTESTINAL: She endorses nausea, indigestion. No diarrhea, abdominal pain, hematemesis, or melena. GENITOURINARY: No dysuria or hematuria. Patient has neurogenic bladder. ENDO: No polyuria or polydipsia. HEMATOLOGIC: No easy bleeding. SKIN: No ulcers. MUSCULOSKELETAL: She has intermittent joint pain mainly lower extremity pain but typically pain all over. NEUROLOGIC: She is weak on her left side over her right side that has been going on for 2 to 3 years now. PSYCH: Denies any suicidal ideation.   PHYSICAL EXAMINATION:  VITAL SIGNS: Temperature 96.5, pulse 69, respiratory rate 20, blood pressure 151/80, sating 97% on room air.   GENERAL: Lying in bed in no apparent distress.   HEENT: Normocephalic, atraumatic. Pupils are equal, symmetric. Nares without discharge. Moist mucous membrane.   NECK: Soft and supple. No adenopathy or JVP.   CARDIOVASCULAR: Non-tachy. No murmurs, rubs, or gallops.   LUNGS: Clear  to auscultation bilaterally. No use of accessory muscles or increased respiratory effort.   ABDOMEN: Soft, nontender, positive bowel sounds. No mass appreciated.   EXTREMITIES:  No edema. Dorsal pedis pulses intact.   MUSCULOSKELETAL: No joint effusion.   SKIN: No ulcers.   NEUROLOGIC: Symmetrical strength. No focal deficits. I do not notice differential weakness between her left and right side.   PSYCH: She is alert and oriented. Patient is cooperative.   LABORATORY, DIAGNOSTIC AND RADIOLOGICAL DATA: WBC 6.5, hemoglobin 12.5, hematocrit 37.3, platelets 217, MCV 91, lipase 167, glucose 92, BUN 6, creatinine 0.77, sodium 141, potassium 3.6, chloride 105, carbon dioxide 30, calcium 9.1. Troponin less than 0.02. EKG with sinus rate of 72. No ST elevation or depression. There is T wave inversion in aVR and V1. Chest x-ray without any acute findings.   ASSESSMENT AND PLAN: Ms. Attwood is a 54 year old woman with history of chronic pain, neuropathy, neurogenic bladder, questionable seizure disorder, questionable MS versus Parkinson's, depression, distant alcohol abuse, ongoing tobacco abuse, hyperlipidemia, presenting with chest pain, diaphoresis, nausea, bilateral arm pain.  1. Chest pain, atypical. Her chest pain seemed to be reproducible, however, she does have risk factors and she does have some typical symptoms. Continue on tele. Cycle enzymes. EKG is unrevealing as well as first troponin negative. Will restart her aspirin and statin. Start patient on low-dose beta blocker and nitroglycerin sublingual as needed. Order for a Myoview. Send TSH, fasting lipid panel, A1c.  2. Hypertension with no prior diagnosis. As above, start on low-dose beta blocker and continue to follow.  3. Depression, controlled. Restart her sertraline. She reports poor p.o. intake and indigestion. Will send LFTs.  4. Hyperlipidemia. Restart simvastatin and fasting lipid panel as above.  5. Tobacco use. Start nicotine patch.  6. Prophylaxis on aspirin, Protonix and encourage ambulation.   TIME SPENT: Approximately 45 minutes spent on patient care.   ____________________________ Rita Ohara, MD ap:cms D: 01/14/2011 23:11:47 ET T: 01/15/2011 07:01:11 ET JOB#: [id number]  cc: Brien Few Tiyana Galla, MD, <Dictator> Renette Butters. Tamala Julian, MD Rita Ohara MD ELECTRONICALLY SIGNED 01/27/2011 2:23

## 2014-05-11 ENCOUNTER — Other Ambulatory Visit: Payer: Self-pay | Admitting: Family Medicine

## 2014-06-01 ENCOUNTER — Encounter: Payer: Self-pay | Admitting: Family Medicine

## 2014-06-04 ENCOUNTER — Other Ambulatory Visit: Payer: Self-pay | Admitting: Family Medicine

## 2014-06-08 NOTE — Telephone Encounter (Signed)
Please CALL IN refill for Ambien CR as approved.

## 2014-06-08 NOTE — Telephone Encounter (Signed)
Called in.

## 2014-07-26 ENCOUNTER — Encounter: Payer: Self-pay | Admitting: Family Medicine

## 2014-07-26 ENCOUNTER — Ambulatory Visit (INDEPENDENT_AMBULATORY_CARE_PROVIDER_SITE_OTHER): Payer: Managed Care, Other (non HMO) | Admitting: Family Medicine

## 2014-07-26 VITALS — BP 115/65 | HR 71 | Temp 98.2°F | Resp 16 | Ht 68.0 in | Wt 178.8 lb

## 2014-07-26 DIAGNOSIS — G4700 Insomnia, unspecified: Secondary | ICD-10-CM | POA: Diagnosis not present

## 2014-07-26 DIAGNOSIS — G8929 Other chronic pain: Secondary | ICD-10-CM | POA: Diagnosis not present

## 2014-07-26 DIAGNOSIS — Z720 Tobacco use: Secondary | ICD-10-CM

## 2014-07-26 DIAGNOSIS — E7800 Pure hypercholesterolemia, unspecified: Secondary | ICD-10-CM

## 2014-07-26 DIAGNOSIS — L989 Disorder of the skin and subcutaneous tissue, unspecified: Secondary | ICD-10-CM | POA: Diagnosis not present

## 2014-07-26 DIAGNOSIS — M797 Fibromyalgia: Secondary | ICD-10-CM

## 2014-07-26 DIAGNOSIS — F418 Other specified anxiety disorders: Secondary | ICD-10-CM | POA: Diagnosis not present

## 2014-07-26 DIAGNOSIS — M79641 Pain in right hand: Secondary | ICD-10-CM

## 2014-07-26 DIAGNOSIS — Z1231 Encounter for screening mammogram for malignant neoplasm of breast: Secondary | ICD-10-CM | POA: Diagnosis not present

## 2014-07-26 NOTE — Progress Notes (Signed)
Subjective:    Patient ID: Christie Benjamin, female    DOB: August 20, 1960, 54 y.o.   MRN: [id number]  07/26/2014  Depression and Referral   HPI This 55 y.o. female presents for three month follow-up:  1. Depression and anxiety; improved at last visit yet suffering with persistent anxiety; Prozac increased to $RemoveBefo'60mg'lBSApIvzCrA$  daily.  Much improved; still having some anxiety but nothing like it was prior to last visit.  No SI/HI.  Mother in law is 21 years old.    2.  Hypercholesterolemia: Patient reports good compliance with medication, good tolerance to medication, and good symptom control.  LDL at goal at last visit.  Dad has a big garden; took care of dad's garden.    3. Glucose Intolerance:  Tries to be compliant with low sugar food choices.  4.  Insomnia: Taking Ambien 12.$RemoveBeforeDE'5mg'nwKutWPcFPnhFQN$  1/2 qhs most nights; will sleep well unless pain is horrible.    5.  Fibromyalgia:  S/p evaluation by pain management on 06/30/14; no changes to management made at visit; Lidocaine ointment added.  Swelling has recurred in fingers; pain management is considering referring back to rheumatology; using Lidocaine ointment to apply to hands and arms.  Because of swelling, concerned about rheumatologic process.  Going to schedule appointment at next visit.  First episode in several months of being this severe; last severe episode was several months ago.  Doing anything is hard.  Lyrica once daily but has increased to bid; Baclofen bid; hydrocodone bid, lidocaine ointment PRN.  Hydrocodone keeps pt awake.  Has 75 pound dog; does not pick up dog.  Getting in pool twice weekly for aerobics.  Getting in home pool daily to get range of motion.  6.  Tobacco abuse:  Smoking 1 ppd.  7. Skin lesion: R arm; not healed; bleeds easily; scab area.  7.  Breast cancer screening: now agreeable. Cousin with breast biopsy recently.    Review of Systems  Constitutional: Negative for fever, chills, diaphoresis and fatigue.  Eyes: Negative for visual  disturbance.  Respiratory: Negative for cough and shortness of breath.   Cardiovascular: Negative for chest pain, palpitations and leg swelling.  Gastrointestinal: Negative for nausea, vomiting, abdominal pain, diarrhea and constipation.  Endocrine: Negative for cold intolerance, heat intolerance, polydipsia, polyphagia and polyuria.  Musculoskeletal: Positive for myalgias, back pain, joint swelling, arthralgias and gait problem.  Skin: Positive for color change.  Neurological: Negative for dizziness, tremors, seizures, syncope, facial asymmetry, speech difficulty, weakness, light-headedness, numbness and headaches.  Psychiatric/Behavioral: Positive for sleep disturbance. Negative for suicidal ideas, self-injury and dysphoric mood. The patient is nervous/anxious.     Past Medical History  Diagnosis Date  . GERD (gastroesophageal reflux disease)   . Pain in joint, shoulder region   . Neuralgia, neuritis, and radiculitis, unspecified   . Diverticulosis of colon (without mention of hemorrhage)   . Disturbance of skin sensation   . Tobacco use disorder   . Personal history of alcoholism   . Depressive disorder, not elsewhere classified   . Pure hypercholesterolemia   . Allergic rhinitis, cause unspecified   . Myalgia and myositis, unspecified   . Neurogenic bladder, NOS   . Other abnormal glucose   . Internal hemorrhoids without mention of complication   . Unspecified venous (peripheral) insufficiency   . Seizures   . Fibromyalgia 06/08/2013    rheumatology consultation [facility].  Marland Kitchen Anxiety    Past Surgical History  Procedure Laterality Date  . Temporomandibular joint surgery    .  Breast enhancement surgery    . Interstim implant placement    . Vaginal hysterectomy  1996    endometriosis;  ovaries removed.  . Colonoscopy  11/04/2008    IH: repeat in 5 years.   Allergies  Allergen Reactions  . Septra Ds [Sulfamethoxazole-Trimethoprim] Hives   Current Outpatient Prescriptions    Medication Sig Dispense Refill  . aspirin 81 MG tablet Take 81 mg by mouth daily.    . baclofen (LIORESAL) 10 MG tablet Take 1.5 tablets (15 mg total) by mouth 3 (three) times daily. 140 tablet 5  . Casanthranol-Docusate Sodium 30-100 MG CAPS Take 100 mg by mouth daily.    . cholecalciferol (VITAMIN D) 1000 UNITS tablet Take 1,000 Units by mouth daily.    . diclofenac sodium (VOLTAREN) 1 % GEL Apply topically.    Marland Kitchen FLUoxetine (PROZAC) 20 MG tablet Take 3 tablets (60 mg total) by mouth daily. 90 tablet 5  . fluticasone (FLONASE) 50 MCG/ACT nasal spray PLACE 2 SPRAYS INTO THE NOSE DAILY. 16 g 11  . furosemide (LASIX) 20 MG tablet Take 1 tablet (20 mg total) by mouth daily. 30 tablet 11  . HYDROcodone-acetaminophen (NORCO/VICODIN) 5-325 MG per tablet TAKE 1 TABLET BY MOUTH TWICE A DAY 60 tablet 0  . LYRICA 150 MG capsule TAKE ONE CAPSULE BY MOUTH 4 TIMES A DAY 120 capsule 1  . omeprazole (PRILOSEC) 20 MG capsule Take 20 mg by mouth 2 (two) times daily.    . potassium chloride (KLOR-CON) 20 MEQ packet Take 20 mEq by mouth daily. 30 tablet 11  . simvastatin (ZOCOR) 40 MG tablet TAKE 1 TABLET (40 MG TOTAL) BY MOUTH AT BEDTIME. 30 tablet 11  . zolpidem (AMBIEN CR) 12.5 MG CR tablet TAKE 1 TABLET BY MOUTH AT BEDTIME 30 tablet 5   No current facility-administered medications for this visit.   Social History   Social History  . Marital Status: Married    Spouse Name: N/A  . Number of Children: 2  . Years of Education: 12   Occupational History  . unemployed     not looking for work since 2008   Social History Main Topics  . Smoking status: Current Every Day Smoker -- 1.00 packs/day for 30 years    Types: Cigarettes  . Smokeless tobacco: Never Used  . Alcohol Use: No     Comment: recovering alcoholic [id number]  . Drug Use: No  . Sexual Activity: Yes    Birth Control/ Protection: Post-menopausal, Surgical   Other Topics Concern  . Not on file   Social History Narrative   Marital status:  married x 7 years; happily married; third marriage.  No abuse.       Children:  2 children (34, 30); 1 stepdaughter (22); 6 grandchildren.       Lives: with husband, mother-in-law       Employment: unemployed; no disability       Tobacco: 1 ppd x 35 years.       Alcohol: in recovery; last alcohol use in 2008.         Drugs:  None       Exercise:  Water aerobics twice weekly at [employer].      Always uses seat belts. Smoke alarm and carbon monoxide detectors in the home. Guns in the home,stored in locked cabinet.  Pets: dog. Well balanced diet, caffeine use Coffee. 3 servings/day redbull trying to stop. Living Will pat does not have living will, DOES have HCPOA. Organ Donor:YES.  Family History  Problem Relation Age of Onset  . Asthma Mother   . COPD Mother   . Hyperlipidemia Father   . Parkinsonism Father   . Cancer Sister     skin, fingers, spread to bone  . Stroke          Objective:    BP 115/65 mmHg  Pulse 71  Temp(Src) 98.2 F (36.8 C) (Oral)  Resp 16  Ht 5\' 8"  (1.727 m)  Wt 178 lb 12.8 oz (81.103 kg)  BMI 27.19 kg/m2  SpO2 97% Physical Exam  Constitutional: She is oriented to person, place, and time. She appears well-developed and well-nourished. No distress.  HENT:  Head: Normocephalic and atraumatic.  Right Ear: External ear normal.  Left Ear: External ear normal.  Nose: Nose normal.  Mouth/Throat: Oropharynx is clear and moist.  Eyes: Conjunctivae and EOM are normal. Pupils are equal, round, and reactive to light.  Neck: Normal range of motion. Neck supple. Carotid bruit is not present. No thyromegaly present.  Cardiovascular: Normal rate, regular rhythm, normal heart sounds and intact distal pulses.  Exam reveals no gallop and no friction rub.   No murmur heard. Pulmonary/Chest: Effort normal and breath sounds normal. She has no wheezes. She has no rales.  Abdominal: Soft. Bowel sounds are normal. She exhibits no distension and no mass. There is no  tenderness. There is no rebound and no guarding.  Lymphadenopathy:    She has no cervical adenopathy.  Neurological: She is alert and oriented to person, place, and time. No cranial nerve deficit.  Skin: Skin is warm and dry. No rash noted. She is not diaphoretic. No erythema. No pallor.  Skin lesion upper arm with slightly irregular border and color variation.  Psychiatric: She has a normal mood and affect. Her behavior is normal.   Results for orders placed or performed in visit on 04/14/14  Urine culture  Result Value Ref Range   Colony Count NO GROWTH    Organism ID, Bacteria NO GROWTH   CBC with Differential/Platelet  Result Value Ref Range   WBC 11.1 (H) 4.0 - 10.5 K/uL   RBC 4.16 3.87 - 5.11 MIL/uL   Hemoglobin 12.7 12.0 - 15.0 g/dL   HCT 06/14/14 18.5 - 74.5 %   MCV 89.4 78.0 - 100.0 fL   MCH 30.5 26.0 - 34.0 pg   MCHC 34.1 30.0 - 36.0 g/dL   RDW 74.0 52.6 - 09.2 %   Platelets 255 150 - 400 K/uL   MPV 10.8 8.6 - 12.4 fL   Neutrophils Relative % 48 43 - 77 %   Neutro Abs 5.3 1.7 - 7.7 K/uL   Lymphocytes Relative 44 12 - 46 %   Lymphs Abs 4.9 (H) 0.7 - 4.0 K/uL   Monocytes Relative 7 3 - 12 %   Monocytes Absolute 0.8 0.1 - 1.0 K/uL   Eosinophils Relative 1 0 - 5 %   Eosinophils Absolute 0.1 0.0 - 0.7 K/uL   Basophils Relative 0 0 - 1 %   Basophils Absolute 0.0 0.0 - 0.1 K/uL   Smear Review Criteria for review not met   Comprehensive metabolic panel  Result Value Ref Range   Sodium 141 135 - 145 mEq/L   Potassium 3.4 (L) 3.5 - 5.3 mEq/L   Chloride 104 96 - 112 mEq/L   CO2 26 19 - 32 mEq/L   Glucose, Bld 63 (L) 70 - 99 mg/dL   BUN 8 6 - 23 mg/dL  Creat 0.72 0.50 - 1.10 mg/dL   Total Bilirubin 0.4 0.2 - 1.2 mg/dL   Alkaline Phosphatase 74 39 - 117 U/L   AST 18 0 - 37 U/L   ALT 14 0 - 35 U/L   Total Protein 6.6 6.0 - 8.3 g/dL   Albumin 4.3 3.5 - 5.2 g/dL   Calcium 9.1 8.4 - 10.5 mg/dL  Lipid panel  Result Value Ref Range   Cholesterol 163 0 - 200 mg/dL    Triglycerides 104 <150 mg/dL   HDL 47 >=46 mg/dL   Total CHOL/HDL Ratio 3.5 Ratio   VLDL 21 0 - 40 mg/dL   LDL Cholesterol 95 0 - 99 mg/dL  Vitamin B12  Result Value Ref Range   Vitamin B-12 1136 (H) 211 - 911 pg/mL  Vit D  25 hydroxy (rtn osteoporosis monitoring)  Result Value Ref Range   Vit D, 25-Hydroxy 32 30 - 100 ng/mL  TSH  Result Value Ref Range   TSH 0.724 0.350 - 4.500 uIU/mL  Hemoglobin A1c  Result Value Ref Range   Hgb A1c MFr Bld 5.8 (H) <5.7 %   Mean Plasma Glucose 120 (H) <117 mg/dL  POCT UA - Microscopic Only  Result Value Ref Range   WBC, Ur, HPF, POC neg    RBC, urine, microscopic 1-3    Bacteria, U Microscopic neg    Mucus, UA neg    Epithelial cells, urine per micros neg    Crystals, Ur, HPF, POC neg    Casts, Ur, LPF, POC neg    Yeast, UA neg   POCT urinalysis dipstick  Result Value Ref Range   Color, UA yellow    Clarity, UA clear    Glucose, UA neg    Bilirubin, UA neg    Ketones, UA neg    Spec Grav, UA <=1.005    Blood, UA trace    pH, UA 6.5    Protein, UA neg    Urobilinogen, UA 0.2    Nitrite, UA neg    Leukocytes, UA Negative        Assessment & Plan:   1. Depression with anxiety   2. Pure hypercholesterolemia   3. Fibromyalgia   4. Insomnia   5. Encounter for screening mammogram for breast cancer   6. Tobacco abuse   7. Chronic hand pain, right   8. Skin lesion    Orders Placed This Encounter  Procedures  . MM Digital Screening    Standing Status: Future     Number of Occurrences:      Standing Expiration Date: 09/26/2015    Order Specific Question:  Reason for Exam (SYMPTOM  OR DIAGNOSIS REQUIRED)    Answer:  annual screening    Order Specific Question:  Is the patient pregnant?    Answer:  No    Order Specific Question:  Preferred imaging location?    Answer:  [facility]  . Ambulatory referral to Rheumatology    Referral Priority:  Routine    Referral Type:  Consultation    Referral Reason:  Specialty Services  Required    Requested Specialty:  Rheumatology    Number of Visits Requested:  1  . Ambulatory referral to Dermatology    Referral Priority:  Routine    Referral Type:  Consultation    Referral Reason:  Specialty Services Required    Requested Specialty:  Dermatology    Number of Visits Requested:  1    No orders of the defined  types were placed in this encounter.    1. Depression with anxiety: improved; anxiety better with increased dose in Prozac; no changes to therapy at this time. 2.  Hypercholesterolemia: controlled; continue current medications. 3.  Fibromyalgia: worsening pain since last visit; now with swelling B hands with joint pain in hands; refer to rheumatology to rule out autoimmune process. 4.  Insomnia: controlled with Ambien CR 12.$RemoveBefo'5mg'lQsvyPNFLQA$  qhs. 5.  Breast cancer screening: pt now agreeable to mammogram. 6.  Tobacco abuse: pre-contemplative. 7.  Chronic hand pain R>L: recurrent; refer to rheumatology; continue current medications per pain management. 8.  Skin lesion: New. Bleeding and changing; refer to dermatology.  Return in about 4 months (around 11/26/2014) for recheck.    Suhaan Perleberg Elayne Guerin, M.D. [facility] [address] [place], [place]  [phone] phone [phone] fax

## 2014-07-27 ENCOUNTER — Other Ambulatory Visit: Payer: Self-pay

## 2014-07-27 DIAGNOSIS — Z1231 Encounter for screening mammogram for malignant neoplasm of breast: Secondary | ICD-10-CM

## 2014-08-03 ENCOUNTER — Ambulatory Visit
Admission: RE | Admit: 2014-08-03 | Discharge: 2014-08-03 | Disposition: A | Discharge status: Home / Self Care | Payer: Managed Care, Other (non HMO) | Source: Ambulatory Visit | Attending: Family Medicine | Admitting: Family Medicine

## 2014-08-03 ENCOUNTER — Encounter: Payer: Self-pay | Admitting: *Deleted

## 2014-08-03 DIAGNOSIS — Z1231 Encounter for screening mammogram for malignant neoplasm of breast: Secondary | ICD-10-CM

## 2014-08-03 LAB — HM MAMMOGRAPHY

## 2014-08-31 ENCOUNTER — Encounter: Payer: Self-pay | Admitting: Family Medicine

## 2014-09-06 ENCOUNTER — Ambulatory Visit (INDEPENDENT_AMBULATORY_CARE_PROVIDER_SITE_OTHER): Payer: Managed Care, Other (non HMO) | Admitting: Family Medicine

## 2014-09-06 ENCOUNTER — Encounter: Payer: Self-pay | Admitting: Family Medicine

## 2014-09-06 ENCOUNTER — Ambulatory Visit (INDEPENDENT_AMBULATORY_CARE_PROVIDER_SITE_OTHER): Payer: Managed Care, Other (non HMO)

## 2014-09-06 VITALS — BP 106/71 | HR 75 | Temp 98.1°F | Resp 16 | Ht 68.0 in | Wt 177.6 lb

## 2014-09-06 DIAGNOSIS — N390 Urinary tract infection, site not specified: Secondary | ICD-10-CM | POA: Diagnosis not present

## 2014-09-06 DIAGNOSIS — M542 Cervicalgia: Secondary | ICD-10-CM | POA: Diagnosis not present

## 2014-09-06 DIAGNOSIS — M797 Fibromyalgia: Secondary | ICD-10-CM

## 2014-09-06 DIAGNOSIS — R0782 Intercostal pain: Secondary | ICD-10-CM | POA: Diagnosis not present

## 2014-09-06 DIAGNOSIS — K219 Gastro-esophageal reflux disease without esophagitis: Secondary | ICD-10-CM

## 2014-09-06 LAB — POCT UA - MICROSCOPIC ONLY
BACTERIA, U MICROSCOPIC: NEGATIVE
Casts, Ur, LPF, POC: NEGATIVE
Crystals, Ur, HPF, POC: NEGATIVE
Mucus, UA: NEGATIVE
RBC, URINE, MICROSCOPIC: NEGATIVE
WBC, UR, HPF, POC: NEGATIVE
Yeast, UA: NEGATIVE

## 2014-09-06 LAB — POCT URINALYSIS DIPSTICK
Bilirubin, UA: NEGATIVE
Glucose, UA: NEGATIVE
Ketones, UA: NEGATIVE
LEUKOCYTES UA: NEGATIVE
NITRITE UA: NEGATIVE
PH UA: 6.5
PROTEIN UA: NEGATIVE
Spec Grav, UA: 1.01
UROBILINOGEN UA: 0.2

## 2014-09-06 MED ORDER — PREDNISONE 20 MG PO TABS: ORAL_TABLET | ORAL | Status: DC

## 2014-09-06 MED ORDER — METHOCARBAMOL 500 MG PO TABS: 500.0000 mg | ORAL_TABLET | Freq: Four times a day (QID) | ORAL | Status: DC

## 2014-09-06 MED ORDER — OXYCODONE HCL 5 MG PO TABS: 5.0000 mg | ORAL_TABLET | Freq: Four times a day (QID) | ORAL | Status: DC | PRN

## 2014-09-06 NOTE — Progress Notes (Signed)
Subjective:    Patient ID: Christie Benjamin, female    DOB: Jul 02, 1960, 54 y.o.   MRN: [id number]  09/06/2014  Follow-up and Urinary Tract Infection   HPI This 54 y.o. female presents for [place] from recent ED evaluation at [facility]  Presented on 09/01/14 with thoracic back pain. Awoke five days prior with acute onset thoracic back pain.  Hydrocodone was not treating pain at home. Underwent CT thoracic spine (no acute process, +DDD), CT lumbar spine (no acute process, +DDD), CTA (no acute process; +ductus aortic bump).  S/p EKG that revealed NSR with no ST changes.  Troponin I negative; CMET, CBC, Lipase WNL.  U/a revealed small LE, 15 WBCs, 7 squamous epis, rare bacteria, trace blood.  Received GI cocktail, Dilaudid, Valium in ED.  Recommended Ibuprofen twice daily.  Had increased Baclofen before ED visit to three times daily without improvement.  Previous orthopedist/SM is [facility].   Never suffered with dysuria, urgency, frequency. No fever, chills/sweats, flank pain.  Awoke with R sided crick in neck and radiated into R axilla and into anterior chest.  Tolerated pain for five days. Also suffered with heartburn.  Pain is no better.  Treated for muscular strain.  Movement does not worsen pain.  Then the same night pt presented to Ed, went to snap fingers at dog; unable to snap fingers; R hand is not right; having R arm weakness.  No n/t in arm; pain radiates around shoulder blade and anterior chest.  Unable to wipe self with R arm.  Prescribed Oxycodone, Valium, Macrobid without improvement.  Has been applying heat without improvement.  Slept all day yesterday.  Chest pain has improved; pain now mainly in ribs and scapular region.  In 2009, underwent extensive neurological evaluation when CT was identified; aortic bulge was identified then. No one advised pt of aortic bulge so was shocked by diagnosis during recent CT.     Review of Systems  Constitutional: Negative for fever, chills,  diaphoresis and fatigue.  Eyes: Negative for visual disturbance.  Respiratory: Negative for cough, shortness of breath, wheezing and stridor.   Cardiovascular: Positive for chest pain. Negative for palpitations and leg swelling.  Gastrointestinal: Negative for nausea, vomiting, abdominal pain, diarrhea, constipation, blood in stool, abdominal distention, anal bleeding and rectal pain.  Endocrine: Negative for cold intolerance, heat intolerance, polydipsia, polyphagia and polyuria.  Genitourinary: Negative for dysuria, urgency, frequency and flank pain.  Musculoskeletal: Positive for myalgias, neck pain and neck stiffness. Negative for joint swelling.  Skin: Negative for rash.  Neurological: Positive for weakness. Negative for dizziness, tremors, seizures, syncope, facial asymmetry, speech difficulty, light-headedness, numbness and headaches.    Past Medical History  Diagnosis Date  . GERD (gastroesophageal reflux disease)   . Pain in joint, shoulder region   . Neuralgia, neuritis, and radiculitis, unspecified   . Diverticulosis of colon (without mention of hemorrhage)   . Disturbance of skin sensation   . Tobacco use disorder   . Personal history of alcoholism   . Depressive disorder, not elsewhere classified   . Pure hypercholesterolemia   . Allergic rhinitis, cause unspecified   . Myalgia and myositis, unspecified   . Neurogenic bladder, NOS   . Other abnormal glucose   . Internal hemorrhoids without mention of complication   . Unspecified venous (peripheral) insufficiency   . Seizures   . Fibromyalgia 06/08/2013    rheumatology consultation [facility].  Marland Kitchen Anxiety    Past Surgical History  Procedure Laterality Date  .  Temporomandibular joint surgery    . Breast enhancement surgery    . Interstim implant placement    . Vaginal hysterectomy  1996    endometriosis;  ovaries removed.  . Colonoscopy  11/04/2008    IH: repeat in 5 years.   Allergies  Allergen Reactions  . Septra Ds  [Sulfamethoxazole-Trimethoprim] Hives    Social History   Social History  . Marital Status: Married    Spouse Name: N/A  . Number of Children: 2  . Years of Education: 12   Occupational History  . unemployed     not looking for work since 2008   Social History Main Topics  . Smoking status: Current Every Day Smoker -- 1.00 packs/day for 30 years    Types: Cigarettes  . Smokeless tobacco: Never Used  . Alcohol Use: No     Comment: recovering alcoholic [id number]  . Drug Use: No  . Sexual Activity: Yes    Birth Control/ Protection: Post-menopausal, Surgical   Other Topics Concern  . Not on file   Social History Narrative   Marital status: married x 7 years; happily married; third marriage.  No abuse.       Children:  2 children (34, 30); 1 stepdaughter (22); 6 grandchildren.       Lives: with husband, mother-in-law       Employment: unemployed; no disability       Tobacco: 1 ppd x 35 years.       Alcohol: in recovery; last alcohol use in 2008.         Drugs:  None       Exercise:  Water aerobics twice weekly at [employer].      Always uses seat belts. Smoke alarm and carbon monoxide detectors in the home. Guns in the home,stored in locked cabinet.  Pets: dog. Well balanced diet, caffeine use Coffee. 3 servings/day redbull trying to stop. Living Will pat does not have living will, DOES have HCPOA. Organ Donor:YES.       Objective:    BP 106/71 mmHg  Pulse 75  Temp(Src) 98.1 F (36.7 C) (Oral)  Resp 16  Ht 5\' 8"  (1.727 m)  Wt 177 lb 9.6 oz (80.559 kg)  BMI 27.01 kg/m2 Physical Exam  Constitutional: She is oriented to person, place, and time. She appears well-developed and well-nourished. No distress.  HENT:  Head: Normocephalic and atraumatic.  Eyes: Conjunctivae and EOM are normal. Pupils are equal, round, and reactive to light.  Neck: Normal range of motion. Neck supple. Carotid bruit is not present. No thyromegaly present.  Cardiovascular: Normal rate, regular  rhythm, normal heart sounds and intact distal pulses.  Exam reveals no gallop and no friction rub.   No murmur heard. Pulmonary/Chest: Effort normal and breath sounds normal. She has no wheezes. She has no rales.  Abdominal: Soft. Bowel sounds are normal. She exhibits no distension and no mass. There is no tenderness. There is no rebound and no guarding.  Musculoskeletal:       Right shoulder: Normal. She exhibits normal range of motion, no tenderness, no bony tenderness, no pain, no spasm and normal pulse.       Cervical back: She exhibits tenderness, pain and spasm. She exhibits normal range of motion, no bony tenderness and normal pulse.       Thoracic back: Normal. She exhibits normal range of motion, no tenderness, no bony tenderness, no pain, no spasm and normal pulse.  Cervical spine: non-tender midline; +tender paraspinal regions R  subscapular region; full ROM cervical spine without limitation.  Motor 5/5 BUE.  Grip 4/5 BUE.   Lymphadenopathy:    She has no cervical adenopathy.  Neurological: She is alert and oriented to person, place, and time. No cranial nerve deficit.  Skin: Skin is warm and dry. No rash noted. She is not diaphoretic. No erythema. No pallor.  Psychiatric: She has a normal mood and affect. Her behavior is normal.   Results for orders placed or performed in visit on 09/06/14  POCT urinalysis dipstick  Result Value Ref Range   Color, UA Yellow    Clarity, UA clear    Glucose, UA neg    Bilirubin, UA neg    Ketones, UA neg    Spec Grav, UA 1.010    Blood, UA trace    pH, UA 6.5    Protein, UA neg    Urobilinogen, UA 0.2    Nitrite, UA neg    Leukocytes, UA Negative Negative  POCT UA - Microscopic Only  Result Value Ref Range   WBC, Ur, HPF, POC neg    RBC, urine, microscopic neg    Bacteria, U Microscopic neg    Mucus, UA neg    Epithelial cells, urine per micros 0-2    Crystals, Ur, HPF, POC neg    Casts, Ur, LPF, POC neg    Yeast, UA neg    [facility]  reading (PRIMARY) by  Dr. Tamala Julian.  CERVICAL SPINE FILMS: LOSS OF CURVATURE; NO ACUTE PROCESS; MINIMAL DEGENERATIVE CHANGES.      Assessment & Plan:   1. Neck pain   2. UTI (lower urinary tract infection)   3. Gastroesophageal reflux disease without esophagitis   4. Intercostal pain   5. Fibromyalgia    1.  R neck pain with radiculopathy into RUE and into R chest: New.  S/p ED visit at [facility]; records reviewed in detail.  S/p thoracic spine films that revealed DDD without acute abnormality.  S/p cervical spine films without acute process; rx for Prednisone, Robaxin, Oxycodone provided. Continue heat daily and passive ROM.  Refer to ortho for further management.  2.  Chest pain: improved with current treatment; s/p CTA to rule out aortic aneurysm; CTA negative other than noting aortic bump/prominence; no evidence of aneurysm.  3.  GERD: controlled now; had stopped Prilosec but has restarted with resolution of GERD/indigestion symptoms. 4.  UTI: New; diagnosed in ED; repeat u/a today negative; complete Macrobid. Asymptomatic. 5. Fibromyalgia: followed by [facility]; advised pt that willing to provide refill of Oxycodone but pt needs to notify pain specialist this week of new rx for narcotics from another provider. Pt advised NOT to use oxycodone and hydrocodone together; pt and husband expressed understanding; pt to notify pain specialist tomorrow of rx for oxycodone.   Orders Placed This Encounter  Procedures  . Urine culture  . DG Cervical Spine Complete    Standing Status: Future     Number of Occurrences: 1     Standing Expiration Date: 09/06/2015    Order Specific Question:  Reason for Exam (SYMPTOM  OR DIAGNOSIS REQUIRED)    Answer:  R neck pain with radiation into R arm, R scapular region    Order Specific Question:  Is the patient pregnant?    Answer:  No    Order Specific Question:  Preferred imaging location?    Answer:  [facility]  . Ambulatory referral to Orthopedic Surgery     Referral Priority:  Routine  Referral Type:  Surgical    Referral Reason:  Specialty Services Required    Requested Specialty:  Orthopedic Surgery    Number of Visits Requested:  1  . POCT urinalysis dipstick  . POCT UA - Microscopic Only    Meds ordered this encounter  Medications  . nitrofurantoin (MACRODANTIN) 100 MG capsule    Sig: Take 100 mg by mouth 2 (two) times daily. Take one capsule by mouth two times daily  . DISCONTD: oxyCODONE (OXY IR/ROXICODONE) 5 MG immediate release tablet    Sig: Take 5 mg by mouth every 4 (four) hours as needed for severe pain.  . predniSONE (DELTASONE) 20 MG tablet    Sig: Three tablets daily x 2 days then two tablets daily x 5 days then one tablet daily for four days    Dispense:  20 tablet    Refill:  0  . methocarbamol (ROBAXIN) 500 MG tablet    Sig: Take 1-2 tablets (500-1,000 mg total) by mouth 4 (four) times daily.    Dispense:  60 tablet    Refill:  0  . oxyCODONE (OXY IR/ROXICODONE) 5 MG immediate release tablet    Sig: Take 1 tablet (5 mg total) by mouth every 6 (six) hours as needed for severe pain.    Dispense:  30 tablet    Refill:  0    No Follow-up on file.   Christie Benjamin, M.D. [facility] [address] [place], [place]  [address] [phone] phone [phone] fax

## 2014-09-07 LAB — URINE CULTURE
COLONY COUNT: NO GROWTH
ORGANISM ID, BACTERIA: NO GROWTH

## 2014-09-08 ENCOUNTER — Encounter: Payer: Self-pay | Admitting: Family Medicine

## 2014-10-06 ENCOUNTER — Other Ambulatory Visit: Payer: Self-pay | Admitting: Orthopedic Surgery

## 2014-10-06 DIAGNOSIS — M5412 Radiculopathy, cervical region: Secondary | ICD-10-CM

## 2014-10-08 ENCOUNTER — Encounter: Payer: Self-pay | Admitting: Family Medicine

## 2014-10-12 ENCOUNTER — Encounter: Payer: Self-pay | Admitting: Family Medicine

## 2014-10-14 ENCOUNTER — Ambulatory Visit: Admission: RE | Admit: 2014-10-14 | Payer: Managed Care, Other (non HMO) | Source: Ambulatory Visit

## 2014-10-14 MED ORDER — ALPRAZOLAM 0.5 MG PO TABS: 0.5000 mg | ORAL_TABLET | Freq: Once | ORAL | Status: DC

## 2014-10-14 NOTE — Telephone Encounter (Signed)
Rx called in 

## 2014-10-28 ENCOUNTER — Ambulatory Visit: Payer: Managed Care, Other (non HMO)

## 2014-10-30 ENCOUNTER — Other Ambulatory Visit: Payer: Self-pay | Admitting: Family Medicine

## 2014-11-08 ENCOUNTER — Other Ambulatory Visit: Payer: Self-pay | Admitting: Family Medicine

## 2014-11-19 ENCOUNTER — Other Ambulatory Visit: Payer: Self-pay | Admitting: Orthopedic Surgery

## 2014-11-19 ENCOUNTER — Other Ambulatory Visit (HOSPITAL_COMMUNITY): Payer: Self-pay | Admitting: Orthopedic Surgery

## 2014-11-19 DIAGNOSIS — M5412 Radiculopathy, cervical region: Secondary | ICD-10-CM

## 2014-11-28 ENCOUNTER — Other Ambulatory Visit: Payer: Self-pay | Admitting: Family Medicine

## 2014-11-29 ENCOUNTER — Encounter: Payer: Self-pay | Admitting: Family Medicine

## 2014-11-29 ENCOUNTER — Ambulatory Visit (INDEPENDENT_AMBULATORY_CARE_PROVIDER_SITE_OTHER): Payer: Managed Care, Other (non HMO) | Admitting: Family Medicine

## 2014-11-29 VITALS — BP 113/74 | HR 70 | Temp 99.1°F | Resp 16 | Ht 67.0 in | Wt 175.4 lb

## 2014-11-29 DIAGNOSIS — F418 Other specified anxiety disorders: Secondary | ICD-10-CM | POA: Diagnosis not present

## 2014-11-29 DIAGNOSIS — Z720 Tobacco use: Secondary | ICD-10-CM

## 2014-11-29 DIAGNOSIS — G4700 Insomnia, unspecified: Secondary | ICD-10-CM | POA: Diagnosis not present

## 2014-11-29 DIAGNOSIS — Z1159 Encounter for screening for other viral diseases: Secondary | ICD-10-CM | POA: Diagnosis not present

## 2014-11-29 DIAGNOSIS — R339 Retention of urine, unspecified: Secondary | ICD-10-CM

## 2014-11-29 DIAGNOSIS — J069 Acute upper respiratory infection, unspecified: Secondary | ICD-10-CM | POA: Diagnosis not present

## 2014-11-29 DIAGNOSIS — R7309 Other abnormal glucose: Secondary | ICD-10-CM

## 2014-11-29 DIAGNOSIS — E7800 Pure hypercholesterolemia, unspecified: Secondary | ICD-10-CM

## 2014-11-29 DIAGNOSIS — Z114 Encounter for screening for human immunodeficiency virus [HIV]: Secondary | ICD-10-CM

## 2014-11-29 DIAGNOSIS — M797 Fibromyalgia: Secondary | ICD-10-CM

## 2014-11-29 DIAGNOSIS — I878 Other specified disorders of veins: Secondary | ICD-10-CM

## 2014-11-29 LAB — CBC WITH DIFFERENTIAL/PLATELET
BASOS ABS: 0 10*3/uL (ref 0.0–0.1)
Basophils Relative: 0 % (ref 0–1)
Eosinophils Absolute: 0.1 10*3/uL (ref 0.0–0.7)
Eosinophils Relative: 1 % (ref 0–5)
HEMATOCRIT: 39.2 % (ref 36.0–46.0)
HEMOGLOBIN: 13 g/dL (ref 12.0–15.0)
LYMPHS PCT: 27 % (ref 12–46)
Lymphs Abs: 2.6 10*3/uL (ref 0.7–4.0)
MCH: 29.5 pg (ref 26.0–34.0)
MCHC: 33.2 g/dL (ref 30.0–36.0)
MCV: 89.1 fL (ref 78.0–100.0)
MPV: 11 fL (ref 8.6–12.4)
Monocytes Absolute: 0.6 10*3/uL (ref 0.1–1.0)
Monocytes Relative: 6 % (ref 3–12)
NEUTROS ABS: 6.3 10*3/uL (ref 1.7–7.7)
Neutrophils Relative %: 66 % (ref 43–77)
Platelets: 234 10*3/uL (ref 150–400)
RBC: 4.4 MIL/uL (ref 3.87–5.11)
RDW: 13.3 % (ref 11.5–15.5)
WBC: 9.5 10*3/uL (ref 4.0–10.5)

## 2014-11-29 LAB — COMPREHENSIVE METABOLIC PANEL
ALBUMIN: 4.2 g/dL (ref 3.6–5.1)
ALK PHOS: 71 U/L (ref 33–130)
ALT: 17 U/L (ref 6–29)
AST: 24 U/L (ref 10–35)
BILIRUBIN TOTAL: 0.4 mg/dL (ref 0.2–1.2)
BUN: 6 mg/dL — ABNORMAL LOW (ref 7–25)
CALCIUM: 9.4 mg/dL (ref 8.6–10.4)
CO2: 28 mmol/L (ref 20–31)
CREATININE: 0.76 mg/dL (ref 0.50–1.05)
Chloride: 104 mmol/L (ref 98–110)
GLUCOSE: 70 mg/dL (ref 65–99)
Potassium: 4.2 mmol/L (ref 3.5–5.3)
Sodium: 140 mmol/L (ref 135–146)
Total Protein: 6.3 g/dL (ref 6.1–8.1)

## 2014-11-29 LAB — LIPID PANEL
CHOLESTEROL: 162 mg/dL (ref 125–200)
HDL: 47 mg/dL (ref >=46)
LDL Cholesterol: 90 mg/dL (ref <130)
Total CHOL/HDL Ratio: 3.4 Ratio (ref <=5.0)
Triglycerides: 127 mg/dL (ref <150)
VLDL: 25 mg/dL (ref <30)

## 2014-11-29 LAB — HEMOGLOBIN A1C
Hgb A1c MFr Bld: 5.7 % — ABNORMAL HIGH (ref <5.7)
Mean Plasma Glucose: 117 mg/dL — ABNORMAL HIGH (ref <117)

## 2014-11-29 MED ORDER — POTASSIUM CHLORIDE 20 MEQ PO PACK: 20.0000 meq | PACK | Freq: Every day | ORAL | Status: DC

## 2014-11-29 MED ORDER — FUROSEMIDE 20 MG PO TABS: 20.0000 mg | ORAL_TABLET | Freq: Every day | ORAL | Status: DC

## 2014-11-29 MED ORDER — ZOLPIDEM TARTRATE ER 12.5 MG PO TBCR: 12.5000 mg | EXTENDED_RELEASE_TABLET | Freq: Every day | ORAL | Status: DC

## 2014-11-29 MED ORDER — FLUTICASONE PROPIONATE 50 MCG/ACT NA SUSP: 2.0000 | Freq: Every day | NASAL | Status: DC

## 2014-11-29 MED ORDER — SIMVASTATIN 40 MG PO TABS: ORAL_TABLET | ORAL | Status: DC

## 2014-11-29 MED ORDER — FLUOXETINE HCL 20 MG PO TABS: 60.0000 mg | ORAL_TABLET | Freq: Every day | ORAL | Status: DC

## 2014-11-29 MED ORDER — PROMETHAZINE-DM 6.25-15 MG/5ML PO SYRP: 5.0000 mL | ORAL_SOLUTION | Freq: Every evening | ORAL | Status: DC | PRN

## 2014-11-29 MED ORDER — IPRATROPIUM BROMIDE 0.03 % NA SOLN: 2.0000 | Freq: Two times a day (BID) | NASAL | Status: DC

## 2014-11-29 NOTE — Progress Notes (Signed)
Subjective:    Patient ID: Christie Benjamin, female    DOB: 1960-12-19, 53 y.o.   MRN: [id number]  11/29/2014  Follow-up and Anxiety   HPI This 54 y.o. female presents for four month follow-up:  1.  Depression and anxiety: Patient reports good compliance with medication, good tolerance to medication, and good symptom control.   Mother has been sick.  Mom with a lot of leg pain; has 3 bulging disc and DDD lumbar spine.  Concerned that mom has dementia; mom is 58.  Spending the week with her, patient has noticed memory issues. Took for MRI last week; very frustrated.  Mom had medications in cabinet from 2013.  Threw away a lot of medication.  Pt's mother is really hyper.  Mother had 2 Xanax before MRI. Mother wants to be in charge of everything.    2.  Glucose Intolerance: working on dietary modification.  3. Hyperlipidemia: Patient reports good compliance with medication, good tolerance to medication, and good symptom control.     4.  Neck pain: would not perform MRI.  [place] would not perform MRI.  Scheduled for CT scan; orthopedist is Dr. Prescott Parma.  Stopped physical therapy. Shoulders are killing pt. No injections in shoulders or neck.  Three month process.    5.  Fibromyalgia; finally got to see Dr. Jeneen Rinks.  Got in trouble big time by the PA at [facility] two visits ago.  Pt insisted on seeing Dr. Jeneen Rinks in [place].  Went to ED in [place].  Prescribed Oxycodone.  EMR is connected.  PA accused patient that "pt went to the clinic and got pain medicine" Followed up with PCP; referred to orthopedic.  Ortho changed medicatoin from hydrocodone to oxycodone.  Got reprimanded.  Expressed that did not appreciate treatment.  Got reprimanded for filling oxycodone and then contacting pain management. Pt requested to come off of hydrocodone but pain management recommends nothing further.  Having a lot of pain.  Never received call from [facility] regarding appointment; referral  placed 07/26/14.  Follow-up with pain management in 12/2014.  Has not received any benefit from pain management; she is only receiving narcotics.  Needs new mattress.  Feels like sleeping on a board; mattress is good for husband's back.  Nasal congestion, rhinorrhea, scratchy throat, cough. No fever/chills/sweats.   Review of Systems  Constitutional: Negative for fever, chills, diaphoresis and fatigue.  HENT: Positive for congestion, rhinorrhea and sore throat.   Eyes: Negative for visual disturbance.  Respiratory: Positive for cough. Negative for shortness of breath.   Cardiovascular: Negative for chest pain, palpitations and leg swelling.  Gastrointestinal: Positive for constipation. Negative for nausea, vomiting, abdominal pain and diarrhea.  Endocrine: Negative for cold intolerance, heat intolerance, polydipsia, polyphagia and polyuria.  Musculoskeletal: Positive for myalgias, arthralgias, neck pain and neck stiffness.  Neurological: Negative for dizziness, tremors, seizures, syncope, facial asymmetry, speech difficulty, weakness, light-headedness, numbness and headaches.  Psychiatric/Behavioral: Positive for sleep disturbance. Negative for suicidal ideas, self-injury and dysphoric mood. The patient is nervous/anxious.     Past Medical History  Diagnosis Date  . GERD (gastroesophageal reflux disease)   . Pain in joint, shoulder region   . Neuralgia, neuritis, and radiculitis, unspecified   . Diverticulosis of colon (without mention of hemorrhage)   . Disturbance of skin sensation   . Tobacco use disorder   . Personal history of alcoholism ([place])   . Depressive disorder, not elsewhere classified   . Pure hypercholesterolemia   . Allergic rhinitis,  cause unspecified   . Myalgia and myositis, unspecified   . Neurogenic bladder, NOS   . Other abnormal glucose   . Internal hemorrhoids without mention of complication   . Unspecified venous (peripheral) insufficiency   . Seizures ([place])    . Fibromyalgia 06/08/2013    rheumatology consultation [facility].  Marland Kitchen Anxiety    Past Surgical History  Procedure Laterality Date  . Temporomandibular joint surgery    . Breast enhancement surgery    . Interstim implant placement    . Vaginal hysterectomy  1996    endometriosis;  ovaries removed.  . Colonoscopy  11/04/2008    IH: repeat in 5 years.   Allergies  Allergen Reactions  . Septra Ds [Sulfamethoxazole-Trimethoprim] Hives    Social History   Social History  . Marital Status: Married    Spouse Name: N/A  . Number of Children: 2  . Years of Education: 12   Occupational History  . unemployed     not looking for work since 2008   Social History Main Topics  . Smoking status: Current Every Day Smoker -- 1.00 packs/day for 30 years    Types: Cigarettes  . Smokeless tobacco: Never Used  . Alcohol Use: No     Comment: recovering alcoholic [id number]  . Drug Use: No  . Sexual Activity: Yes    Birth Control/ Protection: Post-menopausal, Surgical   Other Topics Concern  . Not on file   Social History Narrative   Marital status: married x 7 years; happily married; third marriage.  No abuse.       Children:  2 children (34, 30); 1 stepdaughter (22); 6 grandchildren.       Lives: with husband, mother-in-law       Employment: unemployed; no disability       Tobacco: 1 ppd x 35 years.       Alcohol: in recovery; last alcohol use in 2008.         Drugs:  None       Exercise:  Water aerobics twice weekly at [employer].      Always uses seat belts. Smoke alarm and carbon monoxide detectors in the home. Guns in the home,stored in locked cabinet.  Pets: dog. Well balanced diet, caffeine use Coffee. 3 servings/day redbull trying to stop. Living Will pat does not have living will, DOES have HCPOA. Organ Donor:YES.   Family History  Problem Relation Age of Onset  . Asthma Mother   . COPD Mother   . Hyperlipidemia Father   . Parkinsonism Father   . Cancer Sister     skin, fingers,  spread to bone  . Stroke         Objective:    BP 113/74 mmHg  Pulse 70  Temp(Src) 99.1 F (37.3 C) (Oral)  Resp 16  Ht 5\' 7"  (1.702 m)  Wt 175 lb 6.4 oz (79.561 kg)  BMI 27.47 kg/m2 Physical Exam  Constitutional: She is oriented to person, place, and time. She appears well-developed and well-nourished. No distress.  HENT:  Head: Normocephalic and atraumatic.  Right Ear: External ear normal.  Left Ear: External ear normal.  Nose: Nose normal.  Mouth/Throat: Oropharynx is clear and moist.  Eyes: Conjunctivae and EOM are normal. Pupils are equal, round, and reactive to light.  Neck: Normal range of motion. Neck supple. Carotid bruit is not present. No thyromegaly present.  Cardiovascular: Normal rate, regular rhythm, normal heart sounds and intact distal pulses.  Exam reveals no gallop and  no friction rub.   No murmur heard. Pulmonary/Chest: Effort normal and breath sounds normal. She has no wheezes. She has no rales.  Abdominal: Soft. Bowel sounds are normal. She exhibits no distension and no mass. There is no tenderness. There is no rebound and no guarding.  Lymphadenopathy:    She has no cervical adenopathy.  Neurological: She is alert and oriented to person, place, and time. No cranial nerve deficit.  Skin: Skin is warm and dry. No rash noted. She is not diaphoretic. No erythema. No pallor.  Psychiatric: She has a normal mood and affect. Her behavior is normal.        Assessment & Plan:   1. Pure hypercholesterolemia   2. Other abnormal glucose   3. Urinary retention   4. Depression with anxiety   5. Fibromyalgia   6. Tobacco abuse   7. Insomnia   8. Screening for HIV (human immunodeficiency virus)   9. Need for hepatitis C screening test   10. Acute upper respiratory infection   11. Venous stasis    -Phenergan DM for cough, Atrovent nasal spray; call in one week if no improvement. -Obtain labs. -Refills of medications provided.   Orders Placed This  Encounter  Procedures  . Culture, Group A Strep  . CBC with Differential/Platelet  . Comprehensive metabolic panel    Order Specific Question:  Has the patient fasted?    Answer:  Yes  . Hemoglobin A1c  . Lipid panel    Order Specific Question:  Has the patient fasted?    Answer:  Yes  . HIV antibody  . Hepatitis C antibody   Meds ordered this encounter  Medications  . simvastatin (ZOCOR) 40 MG tablet    Sig: TAKE 1 TABLET (40 MG TOTAL) BY MOUTH AT BEDTIME.    Dispense:  30 tablet    Refill:  11  . potassium chloride (KLOR-CON) 20 MEQ packet    Sig: Take 20 mEq by mouth daily.    Dispense:  30 tablet    Refill:  11  . furosemide (LASIX) 20 MG tablet    Sig: Take 1 tablet (20 mg total) by mouth daily.    Dispense:  30 tablet    Refill:  11  . fluticasone (FLONASE) 50 MCG/ACT nasal spray    Sig: Place 2 sprays into both nostrils daily.    Dispense:  16 g    Refill:  11  . FLUoxetine (PROZAC) 20 MG tablet    Sig: Take 3 tablets (60 mg total) by mouth daily.    Dispense:  90 tablet    Refill:  0  . ipratropium (ATROVENT) 0.03 % nasal spray    Sig: Place 2 sprays into the nose 2 (two) times daily.    Dispense:  30 mL    Refill:  0  . zolpidem (AMBIEN CR) 12.5 MG CR tablet    Sig: Take 1 tablet (12.5 mg total) by mouth at bedtime.    Dispense:  30 tablet    Refill:  5    This request is for a new prescription for a controlled substance as required by Federal/State law..  . promethazine-dextromethorphan (PROMETHAZINE-DM) 6.25-15 MG/5ML syrup    Sig: Take 5 mLs by mouth at bedtime as needed for cough.    Dispense:  120 mL    Refill:  0    Return in about 4 months (around 03/29/2015) for complete physical examiniation.    Katrianna Friesenhahn Elayne Guerin, M.D. [facility]  [place] [address] [place], [place]  [address] [phone] phone [phone] fax

## 2014-11-30 ENCOUNTER — Ambulatory Visit
Admission: RE | Admit: 2014-11-30 | Discharge: 2014-11-30 | Disposition: A | Discharge status: Home / Self Care | Payer: Managed Care, Other (non HMO) | Source: Ambulatory Visit | Attending: Orthopedic Surgery | Admitting: Orthopedic Surgery

## 2014-11-30 DIAGNOSIS — M47892 Other spondylosis, cervical region: Secondary | ICD-10-CM | POA: Diagnosis not present

## 2014-11-30 DIAGNOSIS — M5412 Radiculopathy, cervical region: Secondary | ICD-10-CM | POA: Insufficient documentation

## 2014-11-30 LAB — HIV ANTIBODY (ROUTINE TESTING W REFLEX): HIV: NONREACTIVE

## 2014-11-30 LAB — HEPATITIS C ANTIBODY: HCV AB: NEGATIVE

## 2014-12-01 LAB — CULTURE, GROUP A STREP: ORGANISM ID, BACTERIA: NORMAL

## 2014-12-05 ENCOUNTER — Encounter: Payer: Self-pay | Admitting: Family Medicine

## 2014-12-09 MED ORDER — AMOXICILLIN-POT CLAVULANATE 875-125 MG PO TABS: 1.0000 | ORAL_TABLET | Freq: Two times a day (BID) | ORAL | Status: DC

## 2014-12-09 MED ORDER — BENZONATATE 100 MG PO CAPS: 100.0000 mg | ORAL_CAPSULE | Freq: Three times a day (TID) | ORAL | Status: DC | PRN

## 2014-12-25 ENCOUNTER — Other Ambulatory Visit: Payer: Self-pay | Admitting: Family Medicine

## 2014-12-28 DIAGNOSIS — M47816 Spondylosis without myelopathy or radiculopathy, lumbar region: Secondary | ICD-10-CM | POA: Insufficient documentation

## 2015-02-27 ENCOUNTER — Other Ambulatory Visit: Payer: Self-pay | Admitting: Family Medicine

## 2015-03-17 ENCOUNTER — Encounter: Payer: Self-pay | Admitting: Family Medicine

## 2015-03-24 MED ORDER — PERMETHRIN 5 % EX CREA: 1.0000 "application " | TOPICAL_CREAM | Freq: Once | CUTANEOUS | Status: DC

## 2015-04-04 ENCOUNTER — Other Ambulatory Visit: Payer: Self-pay

## 2015-04-04 ENCOUNTER — Encounter: Payer: Managed Care, Other (non HMO) | Admitting: Family Medicine

## 2015-04-04 MED ORDER — FLUOXETINE HCL 20 MG PO TABS: ORAL_TABLET | ORAL | Status: DC

## 2015-04-06 ENCOUNTER — Other Ambulatory Visit: Payer: Self-pay

## 2015-04-06 DIAGNOSIS — I878 Other specified disorders of veins: Secondary | ICD-10-CM

## 2015-04-06 DIAGNOSIS — E7800 Pure hypercholesterolemia, unspecified: Secondary | ICD-10-CM

## 2015-04-06 MED ORDER — SIMVASTATIN 40 MG PO TABS: ORAL_TABLET | ORAL | Status: DC

## 2015-04-06 MED ORDER — FUROSEMIDE 20 MG PO TABS: 20.0000 mg | ORAL_TABLET | Freq: Every day | ORAL | Status: DC

## 2015-04-26 ENCOUNTER — Encounter: Payer: Managed Care, Other (non HMO) | Admitting: Family Medicine

## 2015-05-02 ENCOUNTER — Ambulatory Visit
Admission: EM | Admit: 2015-05-02 | Discharge: 2015-05-02 | Disposition: A | Discharge status: Home / Self Care | Payer: Managed Care, Other (non HMO) | Attending: Family Medicine | Admitting: Family Medicine

## 2015-05-02 ENCOUNTER — Encounter: Payer: Self-pay | Admitting: Emergency Medicine

## 2015-05-02 DIAGNOSIS — J40 Bronchitis, not specified as acute or chronic: Secondary | ICD-10-CM

## 2015-05-02 DIAGNOSIS — J0100 Acute maxillary sinusitis, unspecified: Secondary | ICD-10-CM

## 2015-05-02 MED ORDER — AMOXICILLIN-POT CLAVULANATE 875-125 MG PO TABS: 1.0000 | ORAL_TABLET | Freq: Two times a day (BID) | ORAL | Status: DC

## 2015-05-02 MED ORDER — BENZONATATE 100 MG PO CAPS: 100.0000 mg | ORAL_CAPSULE | Freq: Three times a day (TID) | ORAL | Status: DC | PRN

## 2015-05-02 MED ORDER — ALBUTEROL SULFATE HFA 108 (90 BASE) MCG/ACT IN AERS: 2.0000 | INHALATION_SPRAY | RESPIRATORY_TRACT | Status: DC | PRN

## 2015-05-02 NOTE — ED Notes (Signed)
Patient started having symptoms of nasal congestion and cough 1 week ago. Symptoms have progressed to chest congestion with productive cough. Color of mucus is yellow. Patient does have a history of seasonal allergies.

## 2015-05-02 NOTE — Discharge Instructions (Signed)
Take medication as prescribed. Rest. Drink plenty of fluids.   Follow up with your primary care physician this week as needed. Return to Urgent care for new or worsening concerns.    Sinusitis, Adult Sinusitis is redness, soreness, and inflammation of the paranasal sinuses. Paranasal sinuses are air pockets within the bones of your face. They are located beneath your eyes, in the middle of your forehead, and above your eyes. In healthy paranasal sinuses, mucus is able to drain out, and air is able to circulate through them by way of your nose. However, when your paranasal sinuses are inflamed, mucus and air can become trapped. This can allow bacteria and other germs to grow and cause infection. Sinusitis can develop quickly and last only a short time (acute) or continue over a long period (chronic). Sinusitis that lasts for more than 12 weeks is considered chronic. CAUSES Causes of sinusitis include:  Allergies.  Structural abnormalities, such as displacement of the cartilage that separates your nostrils (deviated septum), which can decrease the air flow through your nose and sinuses and affect sinus drainage.  Functional abnormalities, such as when the small hairs (cilia) that line your sinuses and help remove mucus do not work properly or are not present. SIGNS AND SYMPTOMS Symptoms of acute and chronic sinusitis are the same. The primary symptoms are pain and pressure around the affected sinuses. Other symptoms include:  Upper toothache.  Earache.  Headache.  Bad breath.  Decreased sense of smell and taste.  A cough, which worsens when you are lying flat.  Fatigue.  Fever.  Thick drainage from your nose, which often is green and may contain pus (purulent).  Swelling and warmth over the affected sinuses. DIAGNOSIS Your health care provider will perform a physical exam. During your exam, your health care provider may perform any of the following to help determine if you have  acute sinusitis or chronic sinusitis:  Look in your nose for signs of abnormal growths in your nostrils (nasal polyps).  Tap over the affected sinus to check for signs of infection.  View the inside of your sinuses using an imaging device that has a light attached (endoscope). If your health care provider suspects that you have chronic sinusitis, one or more of the following tests may be recommended:  Allergy tests.  Nasal culture. A sample of mucus is taken from your nose, sent to a lab, and screened for bacteria.  Nasal cytology. A sample of mucus is taken from your nose and examined by your health care provider to determine if your sinusitis is related to an allergy. TREATMENT Most cases of acute sinusitis are related to a viral infection and will resolve on their own within 10 days. Sometimes, medicines are prescribed to help relieve symptoms of both acute and chronic sinusitis. These may include pain medicines, decongestants, nasal steroid sprays, or saline sprays. However, for sinusitis related to a bacterial infection, your health care provider will prescribe antibiotic medicines. These are medicines that will help kill the bacteria causing the infection. Rarely, sinusitis is caused by a fungal infection. In these cases, your health care provider will prescribe antifungal medicine. For some cases of chronic sinusitis, surgery is needed. Generally, these are cases in which sinusitis recurs more than 3 times per year, despite other treatments. HOME CARE INSTRUCTIONS  Drink plenty of water. Water helps thin the mucus so your sinuses can drain more easily.  Use a humidifier.  Inhale steam 3-4 times a day (for example, sit in  the bathroom with the shower running).  Apply a warm, moist washcloth to your face 3-4 times a day, or as directed by your health care provider.  Use saline nasal sprays to help moisten and clean your sinuses.  Take medicines only as directed by your health care  provider.  If you were prescribed either an antibiotic or antifungal medicine, finish it all even if you start to feel better. SEEK IMMEDIATE MEDICAL CARE IF:  You have increasing pain or severe headaches.  You have nausea, vomiting, or drowsiness.  You have swelling around your face.  You have vision problems.  You have a stiff neck.  You have difficulty breathing.   This information is not intended to replace advice given to you by your health care provider. Make sure you discuss any questions you have with your health care provider.   Document Released: 12/25/2004 Document Revised: 01/15/2014 Document Reviewed: 01/09/2011 Elsevier Interactive Patient Education [employer].

## 2015-05-02 NOTE — ED Provider Notes (Signed)
[facility]  ____________________________________________  Time seen: Approximately 1:38 PM  I have reviewed the triage vital signs and the nursing notes.   HISTORY  Chief Complaint Nasal Congestion and Cough  HPI Christie Benjamin is a 55 y.o. female presents with husband at bedside, for the complaint of one week of runny nose, nasal congestion with postnasal drainage and cough. Patient reports that she feels that she now has more chest congestion. Patient reports that the cough is primarily a dry nonproductive cough but reports she does have occasional productive cough first thing in the morning. Patient reports frequently blowing her nose and getting thick greenish nasal drainage out. Patient reports that she does have a history of seasonal allergies and has been taken over-the-counter Allegra and Flonase which does help some. Patient states the Allegra definitely does help with her sneezing and watery eyes. Patient states the cough intermittently wakes her up at night and occasionally could hear herself wheeze. Denies any other wheezing.  Denies chest pain, shortness of breath, chest pain with deep breath, dizziness, weakness, hearing changes, extremity pain or extremity swelling.  PCP: Tamala Julian   No LMP recorded. Patient has had a hysterectomy.   Past Medical History  Diagnosis Date  . GERD (gastroesophageal reflux disease)   . Pain in joint, shoulder region   . Neuralgia, neuritis, and radiculitis, unspecified   . Diverticulosis of colon (without mention of hemorrhage)   . Disturbance of skin sensation   . Tobacco use disorder   . Personal history of alcoholism ([place])   . Depressive disorder, not elsewhere classified   . Pure hypercholesterolemia   . Allergic rhinitis, cause unspecified   . Myalgia and myositis, unspecified   . Neurogenic bladder, NOS   . Other abnormal glucose   . Internal hemorrhoids without mention of complication   . Unspecified venous  (peripheral) insufficiency   . Seizures ([place])   . Fibromyalgia 06/08/2013    rheumatology consultation [facility].  Marland Kitchen Anxiety     Patient Active Problem List   Diagnosis Date Noted  . Fibromyalgia 07/26/2014  . BMI 27.0-27.9,adult 12/01/2013  . Tobacco abuse 12/01/2013  . Insomnia 05/27/2013  . Abnormality of gait 12/01/2012  . Depression with anxiety 10/29/2011  . Allergic rhinitis, cause unspecified 10/29/2011  . Pure hypercholesterolemia 10/29/2011  . Other abnormal glucose 10/29/2011  . Muscle weakness 10/29/2011  . Urinary retention 10/29/2011    Past Surgical History  Procedure Laterality Date  . Temporomandibular joint surgery    . Breast enhancement surgery    . Interstim implant placement    . Vaginal hysterectomy  1996    endometriosis;  ovaries removed.  . Colonoscopy  11/04/2008    IH: repeat in 5 years.    Current Outpatient Rx  Name  Route  Sig  Dispense  Refill  . aspirin 81 MG tablet   Oral   Take 81 mg by mouth daily.         Sarajane Marek Sodium 30-100 MG CAPS   Oral   Take 100 mg by mouth daily.         Marland Kitchen FLUoxetine (PROZAC) 20 MG tablet      TAKE 3 TABLETS (60 MG TOTAL) BY MOUTH DAILY.   270 tablet   0   . fluticasone (FLONASE) 50 MCG/ACT nasal spray   Each Nare   Place 2 sprays into both nostrils daily.   16 g   11   . furosemide (LASIX) 20 MG tablet   Oral  Take 1 tablet (20 mg total) by mouth daily.   90 tablet   2   .             We will no longer prescribe thi med after Nov 2105 ...   . LYRICA 150 MG capsule      TAKE ONE CAPSULE BY MOUTH 4 TIMES A DAY Patient taking differently: TAKE ONE CAPSULE BY MOUTH 2 TIMES A DAY   120 capsule   1       .           . potassium chloride (KLOR-CON) 20 MEQ packet   Oral   Take 20 mEq by mouth daily.   30 tablet   11   . simvastatin (ZOCOR) 40 MG tablet      TAKE 1 TABLET (40 MG TOTAL) BY MOUTH AT BEDTIME.   90 tablet   2   . zolpidem (AMBIEN CR) 12.5 MG CR tablet    Oral   Take 1 tablet (12.5 mg total) by mouth at bedtime.   30 tablet   5     This request is for a new prescription for a contr ...   . ALPRAZolam (XANAX) 0.5 MG tablet   Oral   Take 1-2 tablets (0.5-1 mg total) by mouth once. Take 30-60 minutes prior to procedure Patient not taking: Reported on 11/29/2014   2 tablet   0   .           .           .           . cholecalciferol (VITAMIN D) 1000 UNITS tablet   Oral   Take 1,000 Units by mouth daily.         .           .           . nitrofurantoin (MACRODANTIN) 100 MG capsule   Oral   Take 100 mg by mouth 2 (two) times daily. Take one capsule by mouth two times daily         . omeprazole (PRILOSEC) 20 MG capsule   Oral   Take 20 mg by mouth 2 (two) times daily.         .           .           .           .             Allergies Septra ds  Family History  Problem Relation Age of Onset  . Asthma Mother   . COPD Mother   . Hyperlipidemia Father   . Parkinsonism Father   . Cancer Sister     skin, fingers, spread to bone  . Stroke      Social History Social History  Substance Use Topics  . Smoking status: Current Every Day Smoker -- 1.00 packs/day for 30 years    Types: Cigarettes  . Smokeless tobacco: Never Used  . Alcohol Use: No     Comment: recovering alcoholic [id number]    Review of Systems Constitutional: No fever/chills Eyes: No visual changes. ENT: No sore throat. Positive cough, Nasal congestion.  Cardiovascular: Denies chest pain. Respiratory: Denies shortness of breath. Gastrointestinal: No abdominal pain.  No nausea, no vomiting.  No diarrhea.  No constipation. Genitourinary: Negative for dysuria. Musculoskeletal: Negative for back pain. Skin: Negative for rash. Neurological: Negative for headaches, focal weakness  or numbness.  10-point ROS otherwise negative.  ____________________________________________   PHYSICAL EXAM:  VITAL SIGNS: ED Triage Vitals  Enc Vitals Group     BP  05/02/15 1253 126/84 mmHg     Pulse Rate 05/02/15 1253 74     Resp 05/02/15 1253 18     Temp 05/02/15 1253 98.7 F (37.1 C)     Temp Source 05/02/15 1253 Oral     SpO2 05/02/15 1253 97 %     Weight 05/02/15 1253 172 lb (78.019 kg)     Height 05/02/15 1253 5\' 7"  (1.702 m)     Head Cir --      Peak Flow --      Pain Score 05/02/15 1302 5     Pain Loc --      Pain Edu? --      Excl. in [place]? --    Constitutional: Alert and oriented. Well appearing and in no acute distress. Eyes: Conjunctivae are normal. PERRL. EOMI. Head: Atraumatic.Mild to moderate tenderness to palpation bilateral frontal and maxillary sinuses. No swelling. No erythema.   Ears: no erythema, normal TMs bilaterally.   Nose: nasal congestion with bilateral nasal turbinate erythema and edema.   Mouth/Throat: Mucous membranes are moist.  Oropharynx non-erythematous.No tonsillar swelling or exudate.  Neck: No stridor.  No cervical spine tenderness to palpation. Hematological/Lymphatic/Immunilogical: No cervical lymphadenopathy. Cardiovascular: Normal rate, regular rhythm. Grossly normal heart sounds.  Good peripheral circulation. Respiratory: Normal respiratory effort.  No retractions. Lungs CTAB. No wheezes, rales or rhonchi. Good air movement. Dry intermittent cough noted in room. Speaks in complete sentences.  Gastrointestinal: Soft and nontender.  No CVA tenderness. Musculoskeletal: No lower or upper extremity tenderness nor edema.  Bilateral pedal pulses equal and easily palpated.  Neurologic:  Normal speech and language. No gross focal neurologic deficits are appreciated. No gait instability. Skin:  Skin is warm, dry and intact. No rash noted. Psychiatric: Mood and affect are normal. Speech and behavior are normal.  ____________________________________________   LABS (all labs ordered are listed, but only abnormal results are displayed)  Labs Reviewed - No data to  display ____________________________________________   INITIAL IMPRESSION / ASSESSMENT AND PLAN / ED COURSE  Pertinent labs & imaging results that were available during my care of the patient were reviewed by me and considered in my medical decision making (see chart for details).  Very well-appearing patient. No acute distress. Presents for the complaints of 1 week of runny nose, nasal congestion, post nasal drainage and cough. Denies fevers. Reports continues to eat and drink well. Moist mucous membranes. Lungs clear throughout. Abdomen soft and nontender. Suspect sinusitis and bronchitis. Will treat with oral Augmentin, and when necessary Tessalon Perles and when necessary albuterol inhaler as needed. Encourage rest, fluids at PCP follow-up.  Discussed follow up with Primary care physician this week. Discussed follow up and return parameters including no resolution or any worsening concerns. Patient verbalized understanding and agreed to plan.   ____________________________________________   FINAL CLINICAL IMPRESSION(S) / ED DIAGNOSES  Final diagnoses:  Acute maxillary sinusitis, recurrence not specified  Bronchitis      Note: This dictation was prepared with Dragon dictation along with smaller phrase technology. Any transcriptional errors that result from this process are unintentional.    Marylene Land, NP 05/02/15 1513

## 2015-05-04 ENCOUNTER — Encounter: Payer: Self-pay | Admitting: Family Medicine

## 2015-10-05 ENCOUNTER — Other Ambulatory Visit: Payer: Self-pay | Admitting: Family Medicine

## 2015-10-17 ENCOUNTER — Encounter: Payer: Self-pay | Admitting: *Deleted

## 2015-10-17 ENCOUNTER — Ambulatory Visit
Admission: EM | Admit: 2015-10-17 | Discharge: 2015-10-17 | Disposition: A | Discharge status: Home / Self Care | Payer: Managed Care, Other (non HMO) | Attending: Family Medicine | Admitting: Family Medicine

## 2015-10-17 DIAGNOSIS — J9801 Acute bronchospasm: Secondary | ICD-10-CM

## 2015-10-17 DIAGNOSIS — J40 Bronchitis, not specified as acute or chronic: Secondary | ICD-10-CM

## 2015-10-17 DIAGNOSIS — R05 Cough: Secondary | ICD-10-CM

## 2015-10-17 DIAGNOSIS — R059 Cough, unspecified: Secondary | ICD-10-CM

## 2015-10-17 LAB — URINALYSIS COMPLETE WITH MICROSCOPIC (ARMC ONLY)
BILIRUBIN URINE: NEGATIVE
Bacteria, UA: NONE SEEN
GLUCOSE, UA: NEGATIVE mg/dL
KETONES UR: NEGATIVE mg/dL
Leukocytes, UA: NEGATIVE
Nitrite: NEGATIVE
PROTEIN: NEGATIVE mg/dL
RBC / HPF: NONE SEEN RBC/hpf (ref 0–5)
SPECIFIC GRAVITY, URINE: 1.01 (ref 1.005–1.030)
pH: 6 (ref 5.0–8.0)

## 2015-10-17 MED ORDER — BENZONATATE 100 MG PO CAPS: 100.0000 mg | ORAL_CAPSULE | Freq: Three times a day (TID) | ORAL | 0 refills | Status: DC

## 2015-10-17 MED ORDER — IPRATROPIUM-ALBUTEROL 0.5-2.5 (3) MG/3ML IN SOLN
3.0000 mL | Freq: Once | RESPIRATORY_TRACT | Status: AC
  Administered 2015-10-17: 3 mL via RESPIRATORY_TRACT

## 2015-10-17 MED ORDER — DOXYCYCLINE HYCLATE 100 MG PO TABS: 100.0000 mg | ORAL_TABLET | Freq: Two times a day (BID) | ORAL | 0 refills | Status: DC

## 2015-10-17 MED ORDER — PREDNISONE 20 MG PO TABS: ORAL_TABLET | ORAL | 0 refills | Status: DC

## 2015-10-17 MED ORDER — ALBUTEROL SULFATE HFA 108 (90 BASE) MCG/ACT IN AERS: 1.0000 | INHALATION_SPRAY | Freq: Four times a day (QID) | RESPIRATORY_TRACT | 0 refills | Status: DC | PRN

## 2015-10-17 NOTE — ED Triage Notes (Signed)
Left flank pain, onset last Thursday. Then productive cough- yellow/brown. Intermittent fever, dysuria, dizziness since weekend.

## 2015-10-17 NOTE — ED Provider Notes (Signed)
[facility]    CSN: [id number] Arrival date & time: 10/17/15  [id number]     History   Chief Complaint Chief Complaint  Patient presents with  . Back Pain  . Cough  . Dysuria    HPI Christie Benjamin is a 55 y.o. female.   The history is provided by the patient.  URI  Presenting symptoms: congestion, cough, fatigue and fever   Severity:  Moderate Onset quality:  Sudden Duration:  1 week Timing:  Constant Progression:  Worsening Chronicity:  New Relieved by:  Nothing Ineffective treatments:  OTC medications Associated symptoms: wheezing   Risk factors: not elderly, no chronic cardiac disease, no chronic kidney disease, no diabetes mellitus, no immunosuppression, no recent illness and no recent travel  Chronic respiratory disease: unknown; however patient with 30 pack year h/o smoking. Sick contacts: unknown.     Past Medical History:  Diagnosis Date  . Allergic rhinitis, cause unspecified   . Anxiety   . Depressive disorder, not elsewhere classified   . Disturbance of skin sensation   . Diverticulosis of colon (without mention of hemorrhage)   . Fibromyalgia 06/08/2013   rheumatology consultation [facility].  Marland Kitchen GERD (gastroesophageal reflux disease)   . Internal hemorrhoids without mention of complication   . Myalgia and myositis, unspecified   . Neuralgia, neuritis, and radiculitis, unspecified   . Neurogenic bladder, NOS   . Other abnormal glucose   . Pain in joint, shoulder region   . Personal history of alcoholism ([place])   . Pure hypercholesterolemia   . Seizures ([place])   . Tobacco use disorder   . Unspecified venous (peripheral) insufficiency     Patient Active Problem List   Diagnosis Date Noted  . Fibromyalgia 07/26/2014  . BMI 27.0-27.9,adult 12/01/2013  . Tobacco abuse 12/01/2013  . Insomnia 05/27/2013  . Abnormality of gait 12/01/2012  . Depression with anxiety 10/29/2011  . Allergic rhinitis, cause unspecified 10/29/2011  . Pure  hypercholesterolemia 10/29/2011  . Other abnormal glucose 10/29/2011  . Muscle weakness 10/29/2011  . Urinary retention 10/29/2011    Past Surgical History:  Procedure Laterality Date  . BREAST ENHANCEMENT SURGERY    . COLONOSCOPY  11/04/2008   IH: repeat in 5 years.  Barrie Lyme IMPLANT PLACEMENT    . TEMPOROMANDIBULAR JOINT SURGERY    . VAGINAL HYSTERECTOMY  1996   endometriosis;  ovaries removed.    OB History    No data available       Home Medications    Prior to Admission medications   Medication Sig Start Date End Date Taking? Authorizing Provider  aspirin 81 MG tablet Take 81 mg by mouth daily.   Yes Historical Provider, MD  FLUoxetine (PROZAC) 20 MG tablet TAKE 3 TABLETS (60 MG TOTAL) BY MOUTH DAILY. 10/07/15  Yes Wardell Honour, MD  furosemide (LASIX) 20 MG tablet Take 1 tablet (20 mg total) by mouth daily. 04/06/15  Yes Wardell Honour, MD  HYDROcodone-acetaminophen (NORCO/VICODIN) 5-325 MG per tablet TAKE 1 TABLET BY MOUTH TWICE A DAY 10/09/13  Yes Marcial Pacas, MD  LYRICA 150 MG capsule TAKE ONE CAPSULE BY MOUTH 4 TIMES A DAY Patient taking differently: TAKE ONE CAPSULE BY MOUTH 2 TIMES A DAY 10/06/13  Yes Marcial Pacas, MD  morphine (MS CONTIN) 15 MG 12 hr tablet Take 15 mg by mouth every 12 (twelve) hours.   Yes Historical Provider, MD  simvastatin (ZOCOR) 40 MG tablet TAKE 1 TABLET (40 MG TOTAL) BY MOUTH AT  BEDTIME. 04/06/15  Yes Wardell Honour, MD  albuterol (PROVENTIL HFA;VENTOLIN HFA) 108 (90 Base) MCG/ACT inhaler Inhale 1-2 puffs into the lungs every 6 (six) hours as needed for wheezing or shortness of breath. 10/17/15   Norval Gable, MD  ALPRAZolam Duanne Moron) 0.5 MG tablet Take 1-2 tablets (0.5-1 mg total) by mouth once. Take 30-60 minutes prior to procedure Patient not taking: Reported on 11/29/2014 10/14/14   Wardell Honour, MD  amoxicillin-clavulanate (AUGMENTIN) 875-125 MG tablet Take 1 tablet by mouth every 12 (twelve) hours. 05/02/15   Marylene Land, NP  baclofen  (LIORESAL) 10 MG tablet Take 1.5 tablets (15 mg total) by mouth 3 (three) times daily. 12/01/12   Marcial Pacas, MD  benzonatate (TESSALON) 100 MG capsule Take 1 capsule (100 mg total) by mouth every 8 (eight) hours. 10/17/15   Norval Gable, MD  Casanthranol-Docusate Sodium 30-100 MG CAPS Take 100 mg by mouth daily.    Historical Provider, MD  cholecalciferol (VITAMIN D) 1000 UNITS tablet Take 1,000 Units by mouth daily.    Historical Provider, MD  doxycycline (VIBRA-TABS) 100 MG tablet Take 1 tablet (100 mg total) by mouth 2 (two) times daily. 10/17/15   Norval Gable, MD  fluticasone (FLONASE) 50 MCG/ACT nasal spray Place 2 sprays into both nostrils daily. 11/29/14   Wardell Honour, MD  ipratropium (ATROVENT) 0.03 % nasal spray Place 2 sprays into the nose 2 (two) times daily. 11/29/14   Wardell Honour, MD  methocarbamol (ROBAXIN) 500 MG tablet Take 1-2 tablets (500-1,000 mg total) by mouth 4 (four) times daily. Patient not taking: Reported on 11/29/2014 09/06/14   Wardell Honour, MD  nitrofurantoin (MACRODANTIN) 100 MG capsule Take 100 mg by mouth 2 (two) times daily. Take one capsule by mouth two times daily    Historical Provider, MD  omeprazole (PRILOSEC) 20 MG capsule Take 20 mg by mouth 2 (two) times daily.    Historical Provider, MD  oxyCODONE (OXY IR/ROXICODONE) 5 MG immediate release tablet Take 1 tablet (5 mg total) by mouth every 6 (six) hours as needed for severe pain. 09/06/14   Wardell Honour, MD  permethrin (ELIMITE) 5 % cream Apply 1 application topically once. Apply from neck to feet 03/24/15   Wardell Honour, MD  potassium chloride (KLOR-CON) 20 MEQ packet Take 20 mEq by mouth daily. 11/29/14   Wardell Honour, MD  predniSONE (DELTASONE) 20 MG tablet 3 tabs po once x 2 days, then 2 tabs po qd for 3 days, then 1 tab po qd for 3 days, then half a tab po qd for 2 days 10/17/15   Norval Gable, MD  promethazine-dextromethorphan (PROMETHAZINE-DM) 6.25-15 MG/5ML syrup Take 5 mLs by mouth at  bedtime as needed for cough. 11/29/14   Wardell Honour, MD  zolpidem (AMBIEN CR) 12.5 MG CR tablet Take 1 tablet (12.5 mg total) by mouth at bedtime. 11/29/14   Wardell Honour, MD    Family History Family History  Problem Relation Age of Onset  . Asthma Mother   . COPD Mother   . Hyperlipidemia Father   . Parkinsonism Father   . Cancer Sister     skin, fingers, spread to bone  . Stroke      Social History Social History  Substance Use Topics  . Smoking status: Current Every Day Smoker    Packs/day: 1.00    Years: 30.00    Types: Cigarettes  . Smokeless tobacco: Never Used  . Alcohol use No  Comment: recovering alcoholic [id number]     Allergies   Septra ds [sulfamethoxazole-trimethoprim]   Review of Systems Review of Systems  Constitutional: Positive for fatigue and fever.  HENT: Positive for congestion.   Respiratory: Positive for cough and wheezing.      Physical Exam Triage Vital Signs ED Triage Vitals  Enc Vitals Group     BP --      Pulse --      Resp 10/17/15 0826 16     Temp 10/17/15 0826 98.8 F (37.1 C)     Temp Source 10/17/15 0826 Oral     SpO2 10/17/15 0826 96 %     Weight 10/17/15 0832 162 lb (73.5 kg)     Height 10/17/15 0832 5\' 7"  (1.702 m)     Head Circumference --      Peak Flow --      Pain Score --      Pain Loc --      Pain Edu? --      Excl. in [place]? --    Orthostatic VS for the past 24 hrs:  BP- Lying Pulse- Lying BP- Sitting Pulse- Sitting BP- Standing at 0 minutes Pulse- Standing at 0 minutes  10/17/15 0828 131/69 65 97/75 78 107/73 84    Updated Vital Signs Temp 98.8 F (37.1 C) (Oral)   Resp 16   Ht 5\' 7"  (1.702 m)   Wt 162 lb (73.5 kg)   SpO2 96%   BMI 25.37 kg/m   Visual Acuity Right Eye Distance:   Left Eye Distance:   Bilateral Distance:    Right Eye Near:   Left Eye Near:    Bilateral Near:     Physical Exam  Constitutional: She appears well-developed and well-nourished. No distress.  HENT:  Head:  Normocephalic and atraumatic.  Right Ear: Tympanic membrane, external ear and ear canal normal.  Left Ear: Tympanic membrane, external ear and ear canal normal.  Nose: No mucosal edema, rhinorrhea, nose lacerations, sinus tenderness, nasal deformity, septal deviation or nasal septal hematoma. No epistaxis.  No foreign bodies. Right sinus exhibits no maxillary sinus tenderness and no frontal sinus tenderness. Left sinus exhibits no maxillary sinus tenderness and no frontal sinus tenderness.  Mouth/Throat: Uvula is midline, oropharynx is clear and moist and mucous membranes are normal. No oropharyngeal exudate.  Eyes: Conjunctivae and EOM are normal. Pupils are equal, round, and reactive to light. Right eye exhibits no discharge. Left eye exhibits no discharge. No scleral icterus.  Neck: Normal range of motion. Neck supple. No thyromegaly present.  Cardiovascular: Normal rate, regular rhythm and normal heart sounds.   Pulmonary/Chest: Effort normal. No respiratory distress. She has wheezes. She has no rales.  Lymphadenopathy:    She has no cervical adenopathy.  Skin: She is not diaphoretic.  Nursing note and vitals reviewed.    UC Treatments / Results  Labs (all labs ordered are listed, but only abnormal results are displayed) Labs Reviewed  URINALYSIS COMPLETEWITH MICROSCOPIC ([place]) - Abnormal; Notable for the following:       Result Value   Hgb urine dipstick TRACE (*)    Squamous Epithelial / LPF 0-5 (*)    All other components within normal limits    EKG  EKG Interpretation None       Radiology No results found.  Procedures Procedures (including critical care time)  Medications Ordered in UC Medications  ipratropium-albuterol (DUONEB) 0.5-2.5 (3) MG/3ML nebulizer solution 3 mL (3 mLs Nebulization Given 10/17/15 0906)  Initial Impression / Assessment and Plan / UC Course  I have reviewed the triage vital signs and the nursing notes.  Pertinent labs & imaging  results that were available during my care of the patient were reviewed by me and considered in my medical decision making (see chart for details).  Clinical Course      Final Clinical Impressions(s) / UC Diagnoses   Final diagnoses:  Bronchitis  Bronchospasm  Cough    New Prescriptions Discharge Medication List as of 10/17/2015  9:27 AM    START taking these medications   Details  doxycycline (VIBRA-TABS) 100 MG tablet Take 1 tablet (100 mg total) by mouth 2 (two) times daily., Starting Mon 10/17/2015, Normal       1. diagnosis reviewed with patient 2. rx as per orders above; reviewed possible side effects, interactions, risks and benefits  3. Recommend supportive treatment with rest, fluids 4. Follow-up prn if symptoms worsen or don't improve   Norval Gable, MD 10/17/15 1106

## 2015-10-24 ENCOUNTER — Encounter: Payer: Self-pay | Admitting: Family Medicine

## 2015-11-08 ENCOUNTER — Encounter: Payer: Self-pay | Admitting: Family Medicine

## 2015-11-08 ENCOUNTER — Ambulatory Visit (INDEPENDENT_AMBULATORY_CARE_PROVIDER_SITE_OTHER): Payer: Managed Care, Other (non HMO) | Admitting: Family Medicine

## 2015-11-08 VITALS — BP 120/70 | HR 73 | Temp 98.6°F | Resp 16 | Ht 67.25 in | Wt 167.6 lb

## 2015-11-08 DIAGNOSIS — Z720 Tobacco use: Secondary | ICD-10-CM | POA: Diagnosis not present

## 2015-11-08 DIAGNOSIS — R251 Tremor, unspecified: Secondary | ICD-10-CM | POA: Diagnosis not present

## 2015-11-08 DIAGNOSIS — M797 Fibromyalgia: Secondary | ICD-10-CM

## 2015-11-08 DIAGNOSIS — F418 Other specified anxiety disorders: Secondary | ICD-10-CM

## 2015-11-08 DIAGNOSIS — I878 Other specified disorders of veins: Secondary | ICD-10-CM

## 2015-11-08 DIAGNOSIS — R7309 Other abnormal glucose: Secondary | ICD-10-CM | POA: Diagnosis not present

## 2015-11-08 DIAGNOSIS — E7800 Pure hypercholesterolemia, unspecified: Secondary | ICD-10-CM | POA: Diagnosis not present

## 2015-11-08 DIAGNOSIS — J301 Allergic rhinitis due to pollen: Secondary | ICD-10-CM

## 2015-11-08 LAB — COMPREHENSIVE METABOLIC PANEL
ALK PHOS: 55 U/L (ref 33–130)
ALT: 18 U/L (ref 6–29)
AST: 23 U/L (ref 10–35)
Albumin: 4.2 g/dL (ref 3.6–5.1)
BUN: 7 mg/dL (ref 7–25)
CALCIUM: 9.2 mg/dL (ref 8.6–10.4)
CHLORIDE: 106 mmol/L (ref 98–110)
CO2: 28 mmol/L (ref 20–31)
Creat: 0.74 mg/dL (ref 0.50–1.05)
GLUCOSE: 74 mg/dL (ref 65–99)
POTASSIUM: 3.9 mmol/L (ref 3.5–5.3)
Sodium: 141 mmol/L (ref 135–146)
Total Bilirubin: 0.4 mg/dL (ref 0.2–1.2)
Total Protein: 6.4 g/dL (ref 6.1–8.1)

## 2015-11-08 LAB — CBC WITH DIFFERENTIAL/PLATELET
BASOS PCT: 0 %
Basophils Absolute: 0 cells/uL (ref 0–200)
EOS ABS: 88 {cells}/uL (ref 15–500)
Eosinophils Relative: 1 %
HCT: 36.8 % (ref 35.0–45.0)
Hemoglobin: 12.5 g/dL (ref 11.7–15.5)
LYMPHS PCT: 30 %
Lymphs Abs: 2640 cells/uL (ref 850–3900)
MCH: 30.6 pg (ref 27.0–33.0)
MCHC: 34 g/dL (ref 32.0–36.0)
MCV: 90 fL (ref 80.0–100.0)
MPV: 10.2 fL (ref 7.5–12.5)
Monocytes Absolute: 528 cells/uL (ref 200–950)
Monocytes Relative: 6 %
Neutro Abs: 5544 cells/uL (ref 1500–7800)
Neutrophils Relative %: 63 %
Platelets: 225 10*3/uL (ref 140–400)
RBC: 4.09 MIL/uL (ref 3.80–5.10)
RDW: 14 % (ref 11.0–15.0)
WBC: 8.8 10*3/uL (ref 3.8–10.8)

## 2015-11-08 LAB — HEMOGLOBIN A1C
HEMOGLOBIN A1C: 5.4 % (ref <5.7)
MEAN PLASMA GLUCOSE: 108 mg/dL

## 2015-11-08 LAB — LIPID PANEL
CHOL/HDL RATIO: 3.1 ratio (ref <=5.0)
Cholesterol: 196 mg/dL (ref 125–200)
HDL: 64 mg/dL (ref >=46)
LDL CALC: 105 mg/dL (ref <130)
Triglycerides: 133 mg/dL (ref <150)
VLDL: 27 mg/dL (ref <30)

## 2015-11-08 MED ORDER — SIMVASTATIN 40 MG PO TABS: ORAL_TABLET | ORAL | 1 refills | Status: DC

## 2015-11-08 MED ORDER — FUROSEMIDE 20 MG PO TABS: 20.0000 mg | ORAL_TABLET | Freq: Every day | ORAL | 1 refills | Status: DC

## 2015-11-08 MED ORDER — FLUTICASONE PROPIONATE 50 MCG/ACT NA SUSP: 2.0000 | Freq: Every day | NASAL | 3 refills | Status: DC

## 2015-11-08 MED ORDER — FLUOXETINE HCL 20 MG PO TABS: ORAL_TABLET | ORAL | 1 refills | Status: DC

## 2015-11-08 NOTE — Patient Instructions (Signed)
     IF you received an x-ray today, you will receive an invoice from [facility]. Please contact  Radiology at [phone] with questions or concerns regarding your invoice.   IF you received labwork today, you will receive an invoice from [facility]. Please contact [facility] at [phone] with questions or concerns regarding your invoice.   Our billing staff will not be able to assist you with questions regarding bills from these companies.  You will be contacted with the lab results as soon as they are available. The fastest way to get your results is to activate your My Chart account. Instructions are located on the last page of this paperwork. If you have not heard from us regarding the results in 2 weeks, please contact this office.      

## 2015-11-08 NOTE — Progress Notes (Signed)
Subjective:    Patient ID: Christie Benjamin, female    DOB: 1960/12/19, 55 y.o.   MRN: [id number]  11/08/2015  Follow-up (MEDICATIONS) and Medication Refill (FLONASE and ZOCOR)   HPI This 55 y.o. female presents for evaluation of hypercholesterolemia, allergic rhinitis, and anxiety/depresion with fibromyalgia requiring chronic opioid therapy per [facility] pain management.  Off Ambien for one year; was ready to come off of medication; Dr. Jeneen Rinks recommended weaning off of Ambien.  Waking up in morning and being still sleepy.  Has been trying to wean off of some medication; s/p first set of facet joint injections in lumbar spine. Wants to wean opiates.  Off Baclofen.  Recent bronchitis; s/p pulmonology consultation last week; recommended smoking cessation.  S/p spirometry; no COPD.  Qutting smoking will be a challenge; smoking 1 ppd. Does not smoke in house; husband does not smoke; sister-in-law smokes but visits every day; lives behind pt.  Feels like smoking helps with pain management.   S/p psychiatry consultation prior to back injections.  Emotionally doing well.  Anxiety is fluctuating.  More tremors.    Tremors increasing lately; pain management physician able to see tremors for the first time.  Husband states that tremors more pronounced; first episode in six months.  Referred to neurology.  Sent message yesterday stating that referring to movement disorder. Father with Parkinson's disease; father with intermittent tremors that are stress related or anxious. Not anxious with recent visit.  Appointment with movement disorder clinic tomorrow.  Now not having tremor.  Had tremors for four days straight.  Can feel tremors in stomach.  More obvious in arms and legs.  Husband and physician noticed in face; pt could not feel tremor in face.  .  Depression screen [facility] 2/9 11/08/2015 11/29/2014 09/06/2014 04/14/2014 01/04/2014  Decreased Interest 0 0 0 0 0  Down, Depressed, Hopeless 0 0 0 0 0  PHQ - 2  Score 0 0 0 0 0   GAD 7 : Generalized Anxiety Score 11/08/2015  Nervous, Anxious, on Edge 1  Control/stop worrying 1  Worry too much - different things 1  Trouble relaxing 1  Restless 3  Easily annoyed or irritable 3  Afraid - awful might happen 0  Total GAD 7 Score 10  Anxiety Difficulty Not difficult at all    Review of Systems  Constitutional: Negative for chills, diaphoresis, fatigue and fever.  Eyes: Negative for visual disturbance.  Respiratory: Negative for cough and shortness of breath.   Cardiovascular: Negative for chest pain, palpitations and leg swelling.  Gastrointestinal: Negative for abdominal pain, constipation, diarrhea, nausea and vomiting.  Endocrine: Negative for cold intolerance, heat intolerance, polydipsia, polyphagia and polyuria.  Musculoskeletal: Positive for arthralgias, back pain and myalgias.  Neurological: Positive for tremors. Negative for dizziness, seizures, syncope, facial asymmetry, speech difficulty, weakness, light-headedness, numbness and headaches.  Psychiatric/Behavioral: Positive for dysphoric mood. Negative for self-injury, sleep disturbance and suicidal ideas. The patient is nervous/anxious.     Past Medical History:  Diagnosis Date  . Allergic rhinitis, cause unspecified   . Anxiety   . Depressive disorder, not elsewhere classified   . Disturbance of skin sensation   . Diverticulosis of colon (without mention of hemorrhage)   . Fibromyalgia 06/08/2013   rheumatology consultation [facility].  Marland Kitchen GERD (gastroesophageal reflux disease)   . Internal hemorrhoids without mention of complication   . Myalgia and myositis, unspecified   . Neuralgia, neuritis, and radiculitis, unspecified   . Neurogenic bladder, NOS   . Other  abnormal glucose   . Pain in joint, shoulder region   . Personal history of alcoholism ([place])   . Pure hypercholesterolemia   . Seizures ([place])   . Tobacco use disorder   . Unspecified venous (peripheral) insufficiency     Past Surgical History:  Procedure Laterality Date  . BREAST ENHANCEMENT SURGERY    . COLONOSCOPY  11/04/2008   IH: repeat in 5 years.  Barrie Lyme IMPLANT PLACEMENT    . TEMPOROMANDIBULAR JOINT SURGERY    . VAGINAL HYSTERECTOMY  1996   endometriosis;  ovaries removed.   Allergies  Allergen Reactions  . Septra Ds [Sulfamethoxazole-Trimethoprim] Hives    Social History   Social History  . Marital status: Married    Spouse name: N/A  . Number of children: 2  . Years of education: 12   Occupational History  . unemployed     not looking for work since 2008   Social History Main Topics  . Smoking status: Current Every Day Smoker    Packs/day: 1.00    Years: 30.00    Types: Cigarettes  . Smokeless tobacco: Never Used  . Alcohol use No     Comment: recovering alcoholic [id number]  . Drug use: No  . Sexual activity: Yes    Birth control/ protection: Post-menopausal, Surgical   Other Topics Concern  . Not on file   Social History Narrative   Marital status: married x 7 years; happily married; third marriage.  No abuse.       Children:  2 children (34, 30); 1 stepdaughter (22); 6 grandchildren.       Lives: with husband, mother-in-law       Employment: unemployed; no disability       Tobacco: 1 ppd x 35 years.       Alcohol: in recovery; last alcohol use in 2008.         Drugs:  None       Exercise:  Water aerobics twice weekly at [employer].      Always uses seat belts. Smoke alarm and carbon monoxide detectors in the home. Guns in the home,stored in locked cabinet.  Pets: dog. Well balanced diet, caffeine use Coffee. 3 servings/day redbull trying to stop. Living Will pat does not have living will, DOES have HCPOA. Organ Donor:YES.   Family History  Problem Relation Age of Onset  . Asthma Mother   . COPD Mother   . Hyperlipidemia Father   . Parkinsonism Father   . Cancer Sister     skin, fingers, spread to bone  . Stroke         Objective:    BP 120/70 (BP  Location: Left Arm, Patient Position: Sitting, Cuff Size: Normal)   Pulse 73   Temp 98.6 F (37 C) (Oral)   Resp 16   Ht 5' 7.25" (1.708 m)   Wt 167 lb 9.6 oz (76 kg)   SpO2 98%   BMI 26.06 kg/m  Physical Exam  Constitutional: She is oriented to person, place, and time. She appears well-developed and well-nourished. No distress.  HENT:  Head: Normocephalic and atraumatic.  Right Ear: External ear normal.  Left Ear: External ear normal.  Nose: Nose normal.  Mouth/Throat: Oropharynx is clear and moist.  Eyes: Conjunctivae and EOM are normal. Pupils are equal, round, and reactive to light.  Neck: Normal range of motion. Neck supple. Carotid bruit is not present. No thyromegaly present.  Cardiovascular: Normal rate, regular rhythm, normal heart sounds and intact distal  pulses.  Exam reveals no gallop and no friction rub.   No murmur heard. Pulmonary/Chest: Effort normal and breath sounds normal. She has no wheezes. She has no rales.  Abdominal: Soft. Bowel sounds are normal. She exhibits no distension and no mass. There is no tenderness. There is no rebound and no guarding.  Lymphadenopathy:    She has no cervical adenopathy.  Neurological: She is alert and oriented to person, place, and time. No cranial nerve deficit.  Skin: Skin is warm and dry. No rash noted. She is not diaphoretic. No erythema. No pallor.  Psychiatric: She has a normal mood and affect. Her behavior is normal.        Assessment & Plan:   1. Depression with anxiety   2. Other abnormal glucose   3. Pure hypercholesterolemia   4. Fibromyalgia   5. Tobacco abuse   6. Chronic seasonal allergic rhinitis due to pollen   7. Venous stasis   8. Occasional tremors    -improved emotionally from last visit; refill of Prozac 40mg  daily. -to undergo neurology consultation regarding tremors intermittent.  No active tremors today. -obtain labs. -refills provided. -recommend exercise, weight loss, and low-cholesterol  low-sugar food choices.  Orders Placed This Encounter  Procedures  . CBC with Differential/Platelet  . Comprehensive metabolic panel    Order Specific Question:   Has the patient fasted?    Answer:   Yes  . Lipid panel    Order Specific Question:   Has the patient fasted?    Answer:   Yes  . Hemoglobin A1c   Meds ordered this encounter  Medications  . FLUoxetine (PROZAC) 20 MG tablet    Sig: TAKE 2 TABLETS (40 MG TOTAL) BY MOUTH DAILY.    Dispense:  180 tablet    Refill:  1  . fluticasone (FLONASE) 50 MCG/ACT nasal spray    Sig: Place 2 sprays into both nostrils daily.    Dispense:  48 g    Refill:  3  . furosemide (LASIX) 20 MG tablet    Sig: Take 1 tablet (20 mg total) by mouth daily.    Dispense:  90 tablet    Refill:  1  . simvastatin (ZOCOR) 40 MG tablet    Sig: TAKE 1 TABLET (40 MG TOTAL) BY MOUTH AT BEDTIME.    Dispense:  90 tablet    Refill:  1    Return in about 6 months (around 05/07/2016) for complete physical examiniation.   Demonica Farrey Elayne Guerin, M.D. [facility] [address] [place], [place]  [address] [phone] phone [phone] fax

## 2016-03-19 DIAGNOSIS — M47816 Spondylosis without myelopathy or radiculopathy, lumbar region: Secondary | ICD-10-CM | POA: Insufficient documentation

## 2016-05-08 ENCOUNTER — Encounter: Payer: Self-pay | Admitting: Family Medicine

## 2016-05-08 ENCOUNTER — Ambulatory Visit (INDEPENDENT_AMBULATORY_CARE_PROVIDER_SITE_OTHER): Payer: Commercial Managed Care - PPO | Admitting: Family Medicine

## 2016-05-08 VITALS — BP 112/75 | HR 63 | Resp 18 | Ht 67.0 in | Wt 177.0 lb

## 2016-05-08 DIAGNOSIS — Z1231 Encounter for screening mammogram for malignant neoplasm of breast: Secondary | ICD-10-CM

## 2016-05-08 DIAGNOSIS — Z1211 Encounter for screening for malignant neoplasm of colon: Secondary | ICD-10-CM

## 2016-05-08 DIAGNOSIS — E7439 Other disorders of intestinal carbohydrate absorption: Secondary | ICD-10-CM | POA: Diagnosis not present

## 2016-05-08 DIAGNOSIS — E7800 Pure hypercholesterolemia, unspecified: Secondary | ICD-10-CM

## 2016-05-08 DIAGNOSIS — F419 Anxiety disorder, unspecified: Secondary | ICD-10-CM | POA: Diagnosis not present

## 2016-05-08 DIAGNOSIS — F329 Major depressive disorder, single episode, unspecified: Secondary | ICD-10-CM

## 2016-05-08 DIAGNOSIS — Z0000 Encounter for general adult medical examination without abnormal findings: Secondary | ICD-10-CM | POA: Diagnosis not present

## 2016-05-08 DIAGNOSIS — M797 Fibromyalgia: Secondary | ICD-10-CM

## 2016-05-08 DIAGNOSIS — K5901 Slow transit constipation: Secondary | ICD-10-CM

## 2016-05-08 DIAGNOSIS — I878 Other specified disorders of veins: Secondary | ICD-10-CM

## 2016-05-08 MED ORDER — FUROSEMIDE 20 MG PO TABS: 20.0000 mg | ORAL_TABLET | Freq: Every day | ORAL | 1 refills | Status: DC

## 2016-05-08 MED ORDER — FLUOXETINE HCL 20 MG PO TABS: ORAL_TABLET | ORAL | 1 refills | Status: DC

## 2016-05-08 MED ORDER — FLUTICASONE PROPIONATE 50 MCG/ACT NA SUSP: 2.0000 | Freq: Every day | NASAL | 3 refills | Status: DC

## 2016-05-08 MED ORDER — SIMVASTATIN 40 MG PO TABS: ORAL_TABLET | ORAL | 1 refills | Status: DC

## 2016-05-08 MED ORDER — POTASSIUM CHLORIDE 20 MEQ PO PACK: 20.0000 meq | PACK | Freq: Every day | ORAL | 3 refills | Status: DC

## 2016-05-08 NOTE — Patient Instructions (Addendum)
IF you received an x-ray today, you will receive an invoice from [facility]. Please contact [facility] at [phone] with questions or concerns regarding your invoice.   IF you received labwork today, you will receive an invoice from [place]. Please contact [facility] at [phone] with questions or concerns regarding your invoice.   Our billing staff will not be able to assist you with questions regarding bills from these companies.  You will be contacted with the lab results as soon as they are available. The fastest way to get your results is to activate your My Chart account. Instructions are located on the last page of this paperwork. If you have not heard from [place] regarding the results in 2 weeks, please contact this office.      Preventive Care 40-64 Years, Female Preventive care refers to lifestyle choices and visits with your health care provider that can promote health and wellness. What does preventive care include?  A yearly physical exam. This is also called an annual well check.  Dental exams once or twice a year.  Routine eye exams. Ask your health care provider how often you should have your eyes checked.  Personal lifestyle choices, including:  Daily care of your teeth and gums.  Regular physical activity.  Eating a healthy diet.  Avoiding tobacco and drug use.  Limiting alcohol use.  Practicing safe sex.  Taking low-dose aspirin daily starting at age 55.  Taking vitamin and mineral supplements as recommended by your health care provider. What happens during an annual well check? The services and screenings done by your health care provider during your annual well check will depend on your age, overall health, lifestyle risk factors, and family history of disease. Counseling  Your health care provider may ask you questions about your:  Alcohol use.  Tobacco use.  Drug use.  Emotional well-being.  Home and relationship  well-being.  Sexual activity.  Eating habits.  Work and work Statistician.  Method of birth control.  Menstrual cycle.  Pregnancy history. Screening  You may have the following tests or measurements:  Height, weight, and BMI.  Blood pressure.  Lipid and cholesterol levels. These may be checked every 5 years, or more frequently if you are over 13 years old.  Skin check.  Lung cancer screening. You may have this screening every year starting at age 20 if you have a 30-pack-year history of smoking and currently smoke or have quit within the past 15 years.  Fecal occult blood test (FOBT) of the stool. You may have this test every year starting at age 41.  Flexible sigmoidoscopy or colonoscopy. You may have a sigmoidoscopy every 5 years or a colonoscopy every 10 years starting at age 79.  Hepatitis C blood test.  Hepatitis B blood test.  Sexually transmitted disease (STD) testing.  Diabetes screening. This is done by checking your blood sugar (glucose) after you have not eaten for a while (fasting). You may have this done every 1-3 years.  Mammogram. This may be done every 1-2 years. Talk to your health care provider about when you should start having regular mammograms. This may depend on whether you have a family history of breast cancer.  BRCA-related cancer screening. This may be done if you have a family history of breast, ovarian, tubal, or peritoneal cancers.  Pelvic exam and Pap test. This may be done every 3 years starting at age 24. Starting at age 33, this may be done every 5 years  if you have a Pap test in combination with an HPV test.  Bone density scan. This is done to screen for osteoporosis. You may have this scan if you are at high risk for osteoporosis. Discuss your test results, treatment options, and if necessary, the need for more tests with your health care provider. Vaccines  Your health care provider may recommend certain vaccines, such  as:  Influenza vaccine. This is recommended every year.  Tetanus, diphtheria, and acellular pertussis (Tdap, Td) vaccine. You may need a Td booster every 10 years.  Varicella vaccine. You may need this if you have not been vaccinated.  Zoster vaccine. You may need this after age 75.  Measles, mumps, and rubella (MMR) vaccine. You may need at least one dose of MMR if you were born in 1957 or later. You may also need a second dose.  Pneumococcal 13-valent conjugate (PCV13) vaccine. You may need this if you have certain conditions and were not previously vaccinated.  Pneumococcal polysaccharide (PPSV23) vaccine. You may need one or two doses if you smoke cigarettes or if you have certain conditions.  Meningococcal vaccine. You may need this if you have certain conditions.  Hepatitis A vaccine. You may need this if you have certain conditions or if you travel or work in places where you may be exposed to hepatitis A.  Hepatitis B vaccine. You may need this if you have certain conditions or if you travel or work in places where you may be exposed to hepatitis B.  Haemophilus influenzae type b (Hib) vaccine. You may need this if you have certain conditions. Talk to your health care provider about which screenings and vaccines you need and how often you need them. This information is not intended to replace advice given to you by your health care provider. Make sure you discuss any questions you have with your health care provider. Document Released: 01/21/2015 Document Revised: 09/14/2015 Document Reviewed: 10/26/2014 Elsevier Interactive Patient Education  2017 [employer].

## 2016-05-08 NOTE — Progress Notes (Signed)
Subjective:    Patient ID: Christie Benjamin, female    DOB: Jul 11, 1960, 56 y.o.   MRN: [id number]  05/08/2016  Annual Exam (no pap)   HPI This 56 y.o. female presents for Routine Physical Examination.  Last physical:  11-23-2013 Pap smear:  Hysterectomy; endometriosis; ovaries resected.  Age 10. Mammogram:  07-2014 Colonoscopy:  11-04-2008 Dionne Milo; thinks may be due. Horribly constipated.  Recently worsened.   Eye exam: Dental exam:  Immunization History  Administered Date(s) Administered  . Td 01/09/2008   BP Readings from Last 3 Encounters:  05/08/16 112/75  11/08/15 120/70  05/02/15 126/84   Wt Readings from Last 3 Encounters:  05/08/16 177 lb (80.3 kg)  11/08/15 167 lb 9.6 oz (76 kg)  10/17/15 162 lb (73.5 kg)    DDD lumbar spine:  s/p infusion L4-5, L5-S1 on R on 05/02/16; pain much improved; pain free right now regarding back pain.  Prior to procedure, severity 9/10.  Currently 0/10.  Can last 6 months to 12 months.  Now has undergone both side injections.  Hoping to peform injections in cervical spine.  Follow-up with pain medicine at end of month.  Very excitied. Complete off of Baclofen.   Goes to gym [employer] in [place] every other day; treadmill.  Weights.    Review of Systems  Constitutional: Negative for activity change, appetite change, chills, diaphoresis, fatigue, fever and unexpected weight change.  HENT: Negative for congestion, dental problem, drooling, ear discharge, ear pain, facial swelling, hearing loss, mouth sores, nosebleeds, postnasal drip, rhinorrhea, sinus pressure, sneezing, sore throat, tinnitus, trouble swallowing and voice change.   Eyes: Negative for photophobia, pain, discharge, redness, itching and visual disturbance.  Respiratory: Negative for apnea, cough, choking, chest tightness, shortness of breath, wheezing and stridor.   Cardiovascular: Negative for chest pain, palpitations and leg swelling.  Gastrointestinal: Positive for  abdominal distention and constipation. Negative for abdominal pain, anal bleeding, blood in stool, diarrhea, nausea, rectal pain and vomiting.  Endocrine: Negative for cold intolerance, heat intolerance, polydipsia, polyphagia and polyuria.  Genitourinary: Negative for decreased urine volume, difficulty urinating, dyspareunia, dysuria, enuresis, flank pain, frequency, genital sores, hematuria, menstrual problem, pelvic pain, urgency, vaginal bleeding, vaginal discharge and vaginal pain.       Nocturia x 1.  No urinary leakage.  Musculoskeletal: Positive for back pain. Negative for arthralgias, gait problem, joint swelling, myalgias, neck pain and neck stiffness.  Skin: Negative for color change, pallor, rash and wound.  Allergic/Immunologic: Negative for environmental allergies, food allergies and immunocompromised state.  Neurological: Negative for dizziness, tremors, seizures, syncope, facial asymmetry, speech difficulty, weakness, light-headedness, numbness and headaches.  Hematological: Negative for adenopathy. Does not bruise/bleed easily.  Psychiatric/Behavioral: Negative for agitation, behavioral problems, confusion, decreased concentration, dysphoric mood, hallucinations, self-injury, sleep disturbance and suicidal ideas. The patient is nervous/anxious. The patient is not hyperactive.        Bedtime 9:00pm; falls asleep 11:00pm; wakes up 7:00am.      Past Medical History:  Diagnosis Date  . Allergic rhinitis, cause unspecified   . Anxiety   . Depressive disorder, not elsewhere classified   . Disturbance of skin sensation   . Diverticulosis of colon (without mention of hemorrhage)   . Fibromyalgia 06/08/2013   rheumatology consultation [facility].  Marland Kitchen GERD (gastroesophageal reflux disease)   . Internal hemorrhoids without mention of complication   . Myalgia and myositis, unspecified   . Neuralgia, neuritis, and radiculitis, unspecified   . Neurogenic bladder, NOS   . Other  abnormal glucose     . Pain in joint, shoulder region   . Personal history of alcoholism ([place])   . Pure hypercholesterolemia   . Seizures ([place])   . Tobacco use disorder   . Unspecified venous (peripheral) insufficiency    Past Surgical History:  Procedure Laterality Date  . BREAST ENHANCEMENT SURGERY    . COLONOSCOPY  11/04/2008   IH: repeat in 5 years.  Barrie Lyme IMPLANT PLACEMENT    . TEMPOROMANDIBULAR JOINT SURGERY    . VAGINAL HYSTERECTOMY  1996   endometriosis;  ovaries removed.   Allergies  Allergen Reactions  . Septra Ds [Sulfamethoxazole-Trimethoprim] Hives    Social History   Social History  . Marital status: Married    Spouse name: N/A  . Number of children: 2  . Years of education: 12   Occupational History  . unemployed     not looking for work since 2008   Social History Main Topics  . Smoking status: Current Every Day Smoker    Packs/day: 1.00    Years: 30.00    Types: Cigarettes  . Smokeless tobacco: Never Used  . Alcohol use No     Comment: recovering alcoholic 0102  . Drug use: No  . Sexual activity: Yes    Birth control/ protection: Post-menopausal, Surgical   Other Topics Concern  . Not on file   Social History Narrative   Marital status: married x 9 years to [place]; happily married; third marriage.  No abuse.       Children:  2 children (34, 30); 1 stepdaughter (22); 6 grandchildren.       Lives: with husband, mother-in-law       Employment: unemployed; no disability       Tobacco: 1 ppd x 35 years.       Alcohol: in recovery; last alcohol use in 2008.         Drugs:  None       Exercise: [employer] every other day in 2018.      Always uses seat belts. Smoke alarm and carbon monoxide detectors in the home. Guns in the home,stored in locked cabinet.  Pets: dog. Well balanced diet, caffeine use Coffee. 3 servings/day redbull trying to stop. Living Will pat does not have living will, DOES have HCPOA. Organ Donor:YES.   Family History  Problem Relation  Age of Onset  . Asthma Mother   . COPD Mother   . Hyperlipidemia Father   . Parkinsonism Father   . Cancer Sister        skin, fingers, spread to bone  . Stroke Unknown        Objective:    BP 112/75   Pulse 63   Resp 18   Ht 5\' 7"  (1.702 m)   Wt 177 lb (80.3 kg)   SpO2 99%   BMI 27.72 kg/m  Physical Exam  Constitutional: She is oriented to person, place, and time. She appears well-developed and well-nourished. No distress.  HENT:  Head: Normocephalic and atraumatic.  Right Ear: External ear normal.  Left Ear: External ear normal.  Nose: Nose normal.  Mouth/Throat: Oropharynx is clear and moist.  Eyes: Conjunctivae and EOM are normal. Pupils are equal, round, and reactive to light.  Neck: Normal range of motion and full passive range of motion without pain. Neck supple. No JVD present. Carotid bruit is not present. No thyromegaly present.  Cardiovascular: Normal rate, regular rhythm and normal heart sounds.  Exam reveals no gallop  and no friction rub.   No murmur heard. Pulmonary/Chest: Effort normal and breath sounds normal. She has no wheezes. She has no rales. Right breast exhibits no inverted nipple, no mass, no nipple discharge, no skin change and no tenderness. Left breast exhibits no inverted nipple, no mass, no nipple discharge, no skin change and no tenderness. Breasts are symmetrical.  Breast implants  Abdominal: Soft. Bowel sounds are normal. She exhibits no distension and no mass. There is no tenderness. There is no rebound and no guarding.  Musculoskeletal:       Right shoulder: Normal.       Left shoulder: Normal.       Cervical back: Normal.  Lymphadenopathy:    She has no cervical adenopathy.  Neurological: She is alert and oriented to person, place, and time. She has normal reflexes. No cranial nerve deficit. She exhibits normal muscle tone. Coordination normal.  Skin: Skin is warm and dry. No rash noted. She is not diaphoretic. No erythema. No pallor.    Psychiatric: She has a normal mood and affect. Her behavior is normal. Judgment and thought content normal.  Nursing note and vitals reviewed.  Depression screen [facility] 2/9 05/08/2016 11/08/2015 11/29/2014 09/06/2014 04/14/2014  Decreased Interest 0 0 0 0 0  Down, Depressed, Hopeless 0 0 0 0 0  PHQ - 2 Score 0 0 0 0 0   Fall Risk  05/08/2016 11/08/2015 11/29/2014 04/14/2014 01/04/2014  Falls in the past year? Yes No Yes Yes -  Number falls in past yr: 2 or more - 2 or more - 2 or more  Injury with Fall? No - No - Yes        Assessment & Plan:   1. Routine physical examination   2. Encounter for screening mammogram for breast cancer   3. Pure hypercholesterolemia   4. Glucose intolerance   5. Anxiety and depression   6. Fibromyalgia   7. Slow transit constipation   8. Venous stasis     Orders Placed This Encounter  Procedures  . Mammogram Digital Screening    Standing Status:   Future    Standing Expiration Date:   07/08/2017    Order Specific Question:   Reason for exam:    Answer:   annual scfreening    Order Specific Question:   Is the patient pregnant?    Answer:   No    Order Specific Question:   Preferred imaging location?    Answer:   [facility]  . CBC with Differential/Platelet  . Comprehensive metabolic panel    Order Specific Question:   Has the patient fasted?    Answer:   Yes  . Hemoglobin A1c  . Lipid panel    Order Specific Question:   Has the patient fasted?    Answer:   Yes  . TSH  . Vitamin B12  . VITAMIN D 25 Hydroxy (Vit-D Deficiency, Fractures)  . Ambulatory referral to Gastroenterology    Referral Priority:   Routine    Referral Type:   Consultation    Referral Reason:   Specialty Services Required    Number of Visits Requested:   1  . POCT urinalysis dipstick   Meds ordered this encounter  Medications  . FLUoxetine (PROZAC) 20 MG tablet    Sig: TAKE 2 TABLETS (40 MG TOTAL) BY MOUTH DAILY.    Dispense:  180 tablet    Refill:  1  .  fluticasone (FLONASE) 50 MCG/ACT nasal spray  Sig: Place 2 sprays into both nostrils daily.    Dispense:  48 g    Refill:  3  . furosemide (LASIX) 20 MG tablet    Sig: Take 1 tablet (20 mg total) by mouth daily.    Dispense:  90 tablet    Refill:  1  . potassium chloride (KLOR-CON) 20 MEQ packet    Sig: Take 20 mEq by mouth daily.    Dispense:  90 tablet    Refill:  3  . simvastatin (ZOCOR) 40 MG tablet    Sig: TAKE 1 TABLET (40 MG TOTAL) BY MOUTH AT BEDTIME.    Dispense:  90 tablet    Refill:  1    Return in about 6 months (around 11/08/2016) for recheck high cholesterol, anxiety/depression.   Kristi Elayne Guerin, M.D. Primary Care at [facility] [address] [place], [place]  [address] [phone] phone [phone] fax

## 2016-05-09 LAB — CBC WITH DIFFERENTIAL/PLATELET
BASOS: 0 %
Basophils Absolute: 0 10*3/uL (ref 0.0–0.2)
EOS (ABSOLUTE): 0.1 10*3/uL (ref 0.0–0.4)
EOS: 1 %
HEMOGLOBIN: 13.7 g/dL (ref 11.1–15.9)
Hematocrit: 41.4 % (ref 34.0–46.6)
IMMATURE GRANS (ABS): 0 10*3/uL (ref 0.0–0.1)
IMMATURE GRANULOCYTES: 0 %
LYMPHS ABS: 3.2 10*3/uL — AB (ref 0.7–3.1)
Lymphs: 39 %
MCH: 29.7 pg (ref 26.6–33.0)
MCHC: 33.1 g/dL (ref 31.5–35.7)
MCV: 90 fL (ref 79–97)
MONOS ABS: 0.6 10*3/uL (ref 0.1–0.9)
Monocytes: 8 %
NEUTROS ABS: 4.4 10*3/uL (ref 1.4–7.0)
Neutrophils: 52 %
PLATELETS: 284 10*3/uL (ref 150–379)
RBC: 4.61 x10E6/uL (ref 3.77–5.28)
RDW: 14.5 % (ref 12.3–15.4)
WBC: 8.4 10*3/uL (ref 3.4–10.8)

## 2016-05-09 LAB — LIPID PANEL
CHOLESTEROL TOTAL: 163 mg/dL (ref 100–199)
Chol/HDL Ratio: 2.5 ratio (ref 0.0–4.4)
HDL: 66 mg/dL (ref >39)
LDL CALC: 80 mg/dL (ref 0–99)
TRIGLYCERIDES: 83 mg/dL (ref 0–149)
VLDL Cholesterol Cal: 17 mg/dL (ref 5–40)

## 2016-05-09 LAB — HEMOGLOBIN A1C
Est. average glucose Bld gHb Est-mCnc: 111 mg/dL
HEMOGLOBIN A1C: 5.5 % (ref 4.8–5.6)

## 2016-05-09 LAB — COMPREHENSIVE METABOLIC PANEL
ALT: 22 IU/L (ref 0–32)
AST: 26 IU/L (ref 0–40)
Albumin/Globulin Ratio: 2.3 — ABNORMAL HIGH (ref 1.2–2.2)
Albumin: 4.8 g/dL (ref 3.5–5.5)
Alkaline Phosphatase: 83 IU/L (ref 39–117)
BILIRUBIN TOTAL: 0.4 mg/dL (ref 0.0–1.2)
BUN/Creatinine Ratio: 14 (ref 9–23)
BUN: 11 mg/dL (ref 6–24)
CALCIUM: 9.4 mg/dL (ref 8.7–10.2)
CHLORIDE: 98 mmol/L (ref 96–106)
CO2: 25 mmol/L (ref 18–29)
CREATININE: 0.81 mg/dL (ref 0.57–1.00)
GFR calc non Af Amer: 83 mL/min/{1.73_m2} (ref >59)
GFR, EST AFRICAN AMERICAN: 95 mL/min/{1.73_m2} (ref >59)
GLUCOSE: 63 mg/dL — AB (ref 65–99)
Globulin, Total: 2.1 g/dL (ref 1.5–4.5)
Potassium: 4.4 mmol/L (ref 3.5–5.2)
Sodium: 140 mmol/L (ref 134–144)
TOTAL PROTEIN: 6.9 g/dL (ref 6.0–8.5)

## 2016-05-09 LAB — VITAMIN B12: Vitamin B-12: 1104 pg/mL (ref 232–1245)

## 2016-05-09 LAB — VITAMIN D 25 HYDROXY (VIT D DEFICIENCY, FRACTURES): VIT D 25 HYDROXY: 44.1 ng/mL (ref 30.0–100.0)

## 2016-05-09 LAB — TSH: TSH: 1.51 u[IU]/mL (ref 0.450–4.500)

## 2016-05-10 ENCOUNTER — Encounter: Payer: Self-pay | Admitting: Family Medicine

## 2016-07-12 ENCOUNTER — Other Ambulatory Visit: Payer: Self-pay

## 2016-07-12 ENCOUNTER — Encounter: Payer: Self-pay | Admitting: Gastroenterology

## 2016-07-12 ENCOUNTER — Ambulatory Visit (INDEPENDENT_AMBULATORY_CARE_PROVIDER_SITE_OTHER): Payer: Commercial Managed Care - PPO | Admitting: Gastroenterology

## 2016-07-12 VITALS — BP 126/66 | HR 68 | Temp 98.7°F | Ht 67.0 in | Wt 175.0 lb

## 2016-07-12 DIAGNOSIS — K5901 Slow transit constipation: Secondary | ICD-10-CM

## 2016-07-12 MED ORDER — PEG 3350-KCL-NA BICARB-NACL 420 G PO SOLR: ORAL | 0 refills | Status: DC

## 2016-07-12 NOTE — Progress Notes (Addendum)
Gastroenterology Consultation  Referring Provider:     Wardell Honour, MD Primary Care Physician:  Wardell Honour, MD Primary Gastroenterologist:  Dr. Allen Norris     Reason for Consultation:     Constipation        HPI:   Christie Benjamin is a 56 y.o. y/o female referred for consultation & management of Constipation by Dr. Tamala Julian, Renette Butters, MD.  This patient comes in today with a history of constipation. The patient states she is on chronic pain medication and will not have a bowel movement for approximately 2 weeks before she'll have a bowel movement. Not all the time but sometimes after not having a bowel movement she will then have diarrhea the last 3-5 days. The patient was started on Movantik and reports that it helped slightly but not to an extent that she is moving her bowels regular.  There is no report of any black stools or bloody stools. The patient also has a family history of colon polyps in her mother. The patient's last colonoscopy was in 2010.  Past Medical History:  Diagnosis Date  . Allergic rhinitis, cause unspecified   . Anxiety   . Depressive disorder, not elsewhere classified   . Disturbance of skin sensation   . Diverticulosis of colon (without mention of hemorrhage)   . Fibromyalgia 06/08/2013   rheumatology consultation [facility].  Marland Kitchen GERD (gastroesophageal reflux disease)   . Internal hemorrhoids without mention of complication   . Myalgia and myositis, unspecified   . Neuralgia, neuritis, and radiculitis, unspecified   . Neurogenic bladder, NOS   . Other abnormal glucose   . Pain in joint, shoulder region   . Personal history of alcoholism ([place])   . Pure hypercholesterolemia   . Seizures ([place])   . Tobacco use disorder   . Unspecified venous (peripheral) insufficiency     Past Surgical History:  Procedure Laterality Date  . BREAST ENHANCEMENT SURGERY    . COLONOSCOPY  11/04/2008   IH: repeat in 5 years.  Barrie Lyme IMPLANT PLACEMENT    . TEMPOROMANDIBULAR  JOINT SURGERY    . VAGINAL HYSTERECTOMY  1996   endometriosis;  ovaries removed.    Prior to Admission medications   Medication Sig Start Date End Date Taking? Authorizing Provider  aspirin 81 MG tablet Take 81 mg by mouth daily.   Yes [provider]  cholecalciferol (VITAMIN D) 1000 UNITS tablet Take 1,000 Units by mouth daily.   Yes [provider]  FLUoxetine (PROZAC) 20 MG tablet TAKE 2 TABLETS (40 MG TOTAL) BY MOUTH DAILY. 05/08/16  Yes Wardell Honour, MD  fluticasone [facility]) 50 MCG/ACT nasal spray Place 2 sprays into both nostrils daily. 05/08/16  Yes Wardell Honour, MD  furosemide (LASIX) 20 MG tablet Take 1 tablet (20 mg total) by mouth daily. 05/08/16  Yes Wardell Honour, MD  HYDROcodone-acetaminophen (NORCO/VICODIN) 5-325 MG per tablet TAKE 1 TABLET BY MOUTH TWICE A DAY 10/09/13  Yes Marcial Pacas, MD  ipratropium (ATROVENT) 0.03 % nasal spray Place 2 sprays into the nose 2 (two) times daily. 11/29/14  Yes Wardell Honour, MD  LYRICA 150 MG capsule TAKE ONE CAPSULE BY MOUTH 4 TIMES A DAY Patient taking differently: TAKE ONE CAPSULE BY MOUTH 2 TIMES A DAY 10/06/13  Yes Marcial Pacas, MD  morphine (MS CONTIN) 15 MG 12 hr tablet Take 15 mg by mouth every 12 (twelve) hours.   Yes [provider]  MOVANTIK 25 MG  TABS tablet Take 25 mg by mouth daily. 06/13/16  Yes [provider]  omeprazole (PRILOSEC) 20 MG capsule Take 20 mg by mouth 2 (two) times daily.   Yes [provider]  potassium chloride (KLOR-CON) 20 MEQ packet Take 20 mEq by mouth daily. 05/08/16  Yes Wardell Honour, MD  propranolol (INDERAL) 20 MG tablet Take 20 mg by mouth 3 (three) times daily.   Yes [provider]  simvastatin (ZOCOR) 40 MG tablet TAKE 1 TABLET (40 MG TOTAL) BY MOUTH AT BEDTIME. 05/08/16  Yes Wardell Honour, MD  polyethylene glycol-electrolytes (GAVILYTE-N WITH FLAVOR PACK) 420 g solution Drink one 8 oz glass every 20 mins until stools are clear starting at 5:00pm  on Sunday. 07/12/16   Lucilla Lame, MD    Family History  Problem Relation Age of Onset  . Asthma Mother   . COPD Mother   . Hyperlipidemia Father   . Parkinsonism Father   . Cancer Sister        skin, fingers, spread to bone  . Stroke Unknown      Social History  Substance Use Topics  . Smoking status: Current Every Day Smoker    Packs/day: 1.00    Years: 30.00    Types: Cigarettes  . Smokeless tobacco: Never Used  . Alcohol use No     Comment: recovering alcoholic [id number]    Allergies as of 07/12/2016 - Review Complete 07/12/2016  Allergen Reaction Noted  . Septra ds [sulfamethoxazole-trimethoprim] Hives 10/04/2011  . Tape Rash 03/10/2013    Review of Systems:    All systems reviewed and negative except where noted in HPI.   Physical Exam:  BP 126/66   Pulse 68   Temp 98.7 F (37.1 C) (Oral)   Ht 5\' 7"  (1.702 m)   Wt 175 lb (79.4 kg)   BMI 27.41 kg/m  No LMP recorded. Patient has had a hysterectomy. Psych:  Alert and cooperative. Normal mood and affect. General:   Alert,  Well-developed, well-nourished, pleasant and cooperative in NAD Head:  Normocephalic and atraumatic. Eyes:  Sclera clear, no icterus.   Conjunctiva pink. Ears:  Normal auditory acuity. Nose:  No deformity, discharge, or lesions. Mouth:  No deformity or lesions,oropharynx pink & moist. Neck:  Supple; no masses or thyromegaly. Lungs:  Respirations even and unlabored.  Clear throughout to auscultation.   No wheezes, crackles, or rhonchi. No acute distress. Heart:  Regular rate and rhythm; no murmurs, clicks, rubs, or gallops. Abdomen:  Normal bowel sounds.  No bruits.  Soft, non-tender and non-distended without masses, hepatosplenomegaly or hernias noted.  No guarding or rebound tenderness.  Negative Carnett sign.   Rectal:  Deferred.  Msk:  Symmetrical without gross deformities.  Good, equal movement & strength bilaterally. Pulses:  Normal pulses noted. Extremities:  No clubbing or edema.  No  cyanosis. Neurologic:  Alert and oriented x3;  grossly normal neurologically. Skin:  Intact without significant lesions or rashes.  No jaundice. Lymph Nodes:  No significant cervical adenopathy. Psych:  Alert and cooperative. Normal mood and affect.  Imaging Studies: No results found.  Assessment and Plan:   Christie Benjamin is a 56 y.o. y/o female who comes in today with a history of constipation that has been treated with Movantik without resolution. The patient also has intermittent diarrhea which may be overflow diarrhea versus a result of her additional laxative use when she has not moved her bowels in some time. The patient will be set up  for a colonoscopy due to her family history of colon polyps and her last colonoscopy being 8 years ago. The patient will also be started on Amitiza 24 g twice a day. If the patient gets diarrhea the patient may need to be on 8 g of Amitiza twice a day. If this does not work the patient may also be tried on [place]. The patient and her husband have been explained the plan and agree with it.  Lucilla Lame, MD. [facility]al   Note: This dictation was prepared with Dragon dictation along with smaller phrase technology. Any transcriptional errors that result from this process are unintentional.

## 2016-07-13 NOTE — Discharge Instructions (Signed)
General Anesthesia, Adult, Care After °These instructions provide you with information about caring for yourself after your procedure. Your health care provider may also give you more specific instructions. Your treatment has been planned according to current medical practices, but problems sometimes occur. Call your health care provider if you have any problems or questions after your procedure. °What can I expect after the procedure? °After the procedure, it is common to have: °· Vomiting. °· A sore throat. °· Mental slowness. ° °It is common to feel: °· Nauseous. °· Cold or shivery. °· Sleepy. °· Tired. °· Sore or achy, even in parts of your body where you did not have surgery. ° °Follow these instructions at home: °For at least 24 hours after the procedure: °· Do not: °? Participate in activities where you could fall or become injured. °? Drive. °? Use heavy machinery. °? Drink alcohol. °? Take sleeping pills or medicines that cause drowsiness. °? Make important decisions or sign legal documents. °? Take care of children on your own. °· Rest. °Eating and drinking °· If you vomit, drink water, juice, or soup when you can drink without vomiting. °· Drink enough fluid to keep your urine clear or pale yellow. °· Make sure you have little or no nausea before eating solid foods. °· Follow the diet recommended by your health care provider. °General instructions °· Have a responsible adult stay with you until you are awake and alert. °· Return to your normal activities as told by your health care provider. Ask your health care provider what activities are safe for you. °· Take over-the-counter and prescription medicines only as told by your health care provider. °· If you smoke, do not smoke without supervision. °· Keep all follow-up visits as told by your health care provider. This is important. °Contact a health care provider if: °· You continue to have nausea or vomiting at home, and medicines are not helpful. °· You  cannot drink fluids or start eating again. °· You cannot urinate after 8-12 hours. °· You develop a skin rash. °· You have fever. °· You have increasing redness at the site of your procedure. °Get help right away if: °· You have difficulty breathing. °· You have chest pain. °· You have unexpected bleeding. °· You feel that you are having a life-threatening or urgent problem. °This information is not intended to replace advice given to you by your health care provider. Make sure you discuss any questions you have with your health care provider. °Document Released: 04/02/2000 Document Revised: 05/30/2015 Document Reviewed: 12/09/2014 °Elsevier Interactive Patient Education © 2018 [employer] ° °

## 2016-07-16 ENCOUNTER — Ambulatory Visit: Payer: Commercial Managed Care - PPO | Admitting: Anesthesiology

## 2016-07-16 ENCOUNTER — Ambulatory Visit
Admission: RE | Admit: 2016-07-16 | Discharge: 2016-07-16 | Disposition: A | Discharge status: Home / Self Care | Payer: Commercial Managed Care - PPO | Source: Ambulatory Visit | Attending: Gastroenterology | Admitting: Gastroenterology

## 2016-07-16 ENCOUNTER — Encounter
Admission: RE | Disposition: A | Discharge status: Home / Self Care | Payer: Self-pay | Source: Ambulatory Visit | Attending: Gastroenterology

## 2016-07-16 DIAGNOSIS — I739 Peripheral vascular disease, unspecified: Secondary | ICD-10-CM | POA: Insufficient documentation

## 2016-07-16 DIAGNOSIS — D123 Benign neoplasm of transverse colon: Secondary | ICD-10-CM

## 2016-07-16 DIAGNOSIS — K5901 Slow transit constipation: Secondary | ICD-10-CM

## 2016-07-16 DIAGNOSIS — F329 Major depressive disorder, single episode, unspecified: Secondary | ICD-10-CM | POA: Insufficient documentation

## 2016-07-16 DIAGNOSIS — E7800 Pure hypercholesterolemia, unspecified: Secondary | ICD-10-CM | POA: Diagnosis not present

## 2016-07-16 DIAGNOSIS — Z83719 Family history of colon polyps, unspecified: Secondary | ICD-10-CM

## 2016-07-16 DIAGNOSIS — Z7951 Long term (current) use of inhaled steroids: Secondary | ICD-10-CM | POA: Insufficient documentation

## 2016-07-16 DIAGNOSIS — Z79899 Other long term (current) drug therapy: Secondary | ICD-10-CM | POA: Insufficient documentation

## 2016-07-16 DIAGNOSIS — Z8371 Family history of colonic polyps: Secondary | ICD-10-CM

## 2016-07-16 DIAGNOSIS — F419 Anxiety disorder, unspecified: Secondary | ICD-10-CM | POA: Diagnosis not present

## 2016-07-16 DIAGNOSIS — K219 Gastro-esophageal reflux disease without esophagitis: Secondary | ICD-10-CM | POA: Diagnosis not present

## 2016-07-16 DIAGNOSIS — Z1211 Encounter for screening for malignant neoplasm of colon: Secondary | ICD-10-CM | POA: Insufficient documentation

## 2016-07-16 DIAGNOSIS — Z7982 Long term (current) use of aspirin: Secondary | ICD-10-CM | POA: Diagnosis not present

## 2016-07-16 DIAGNOSIS — F17210 Nicotine dependence, cigarettes, uncomplicated: Secondary | ICD-10-CM | POA: Diagnosis not present

## 2016-07-16 DIAGNOSIS — Z79891 Long term (current) use of opiate analgesic: Secondary | ICD-10-CM | POA: Insufficient documentation

## 2016-07-16 HISTORY — PX: COLONOSCOPY WITH PROPOFOL: SHX5780

## 2016-07-16 HISTORY — PX: POLYPECTOMY: SHX5525

## 2016-07-16 SURGERY — COLONOSCOPY WITH PROPOFOL
Anesthesia: General | Wound class: Contaminated

## 2016-07-16 MED ORDER — LACTATED RINGERS IV SOLN
INTRAVENOUS | Status: DC
  Administered 2016-07-16: 11:00:00 via INTRAVENOUS

## 2016-07-16 MED ORDER — PROPOFOL 10 MG/ML IV BOLUS
INTRAVENOUS | Status: DC | PRN
  Administered 2016-07-16 (×3): 20 mg via INTRAVENOUS
  Administered 2016-07-16: 30 mg via INTRAVENOUS
  Administered 2016-07-16 (×2): 20 mg via INTRAVENOUS
  Administered 2016-07-16: 80 mg via INTRAVENOUS
  Administered 2016-07-16: 30 mg via INTRAVENOUS

## 2016-07-16 MED ORDER — STERILE WATER FOR IRRIGATION IR SOLN
Status: DC | PRN
  Administered 2016-07-16: 12:00:00

## 2016-07-16 MED ORDER — PROMETHAZINE HCL 25 MG/ML IJ SOLN
6.2500 mg | INTRAMUSCULAR | Status: DC | PRN
Start: 2016-07-16 — End: 2016-07-16

## 2016-07-16 MED ORDER — FENTANYL CITRATE (PF) 100 MCG/2ML IJ SOLN: 25.0000 ug | INTRAMUSCULAR | Status: DC | PRN

## 2016-07-16 MED ORDER — OXYCODONE HCL 5 MG/5ML PO SOLN: 5.0000 mg | Freq: Once | ORAL | Status: DC | PRN

## 2016-07-16 MED ORDER — SODIUM CHLORIDE 0.9 % IV SOLN: INTRAVENOUS | Status: DC

## 2016-07-16 MED ORDER — LIDOCAINE HCL (CARDIAC) 20 MG/ML IV SOLN
INTRAVENOUS | Status: DC | PRN
  Administered 2016-07-16: 50 mg via INTRAVENOUS

## 2016-07-16 MED ORDER — OXYCODONE HCL 5 MG PO TABS: 5.0000 mg | ORAL_TABLET | Freq: Once | ORAL | Status: DC | PRN

## 2016-07-16 SURGICAL SUPPLY — 23 items
CANISTER SUCT 1200ML W/VALVE (MISCELLANEOUS) ×4 IMPLANT
CLIP HMST 235XBRD CATH ROT (MISCELLANEOUS) IMPLANT
CLIP RESOLUTION 360 11X235 (MISCELLANEOUS)
FCP ESCP3.2XJMB 240X2.8X (MISCELLANEOUS)
FORCEPS BIOP RAD 4 LRG CAP 4 (CUTTING FORCEPS) IMPLANT
FORCEPS BIOP RJ4 240 W/NDL (MISCELLANEOUS)
FORCEPS ESCP3.2XJMB 240X2.8X (MISCELLANEOUS) IMPLANT
GOWN CVR UNV OPN BCK APRN NK (MISCELLANEOUS) ×4 IMPLANT
GOWN ISOL THUMB LOOP REG UNIV (MISCELLANEOUS) ×8
INJECTOR VARIJECT VIN23 (MISCELLANEOUS) IMPLANT
KIT DEFENDO VALVE AND CONN (KITS) IMPLANT
KIT ENDO PROCEDURE OLY (KITS) ×4 IMPLANT
MARKER SPOT ENDO TATTOO 5ML (MISCELLANEOUS) IMPLANT
PAD GROUND ADULT SPLIT (MISCELLANEOUS) IMPLANT
PROBE APC STR FIRE (PROBE) IMPLANT
RETRIEVER NET ROTH 2.5X230 LF (MISCELLANEOUS) IMPLANT
SNARE SHORT THROW 13M SML OVAL (MISCELLANEOUS) ×2 IMPLANT
SNARE SHORT THROW 30M LRG OVAL (MISCELLANEOUS) IMPLANT
SNARE SNG USE RND 15MM (INSTRUMENTS) IMPLANT
SPOT EX ENDOSCOPIC TATTOO (MISCELLANEOUS)
TRAP ETRAP POLY (MISCELLANEOUS) ×2 IMPLANT
VARIJECT INJECTOR VIN23 (MISCELLANEOUS)
WATER STERILE IRR 250ML POUR (IV SOLUTION) ×4 IMPLANT

## 2016-07-16 NOTE — Transfer of Care (Signed)
Immediate Anesthesia Transfer of Care Note  Patient: Christie Benjamin  Procedure(s) Performed: Procedure(s): COLONOSCOPY WITH PROPOFOL (N/A) POLYPECTOMY  Patient Location: PACU  Anesthesia Type: General  Level of Consciousness: awake, alert  and patient cooperative  Airway and Oxygen Therapy: Patient Spontanous Breathing and Patient connected to supplemental oxygen  Post-op Assessment: Post-op Vital signs reviewed, Patient's Cardiovascular Status Stable, Respiratory Function Stable, Patent Airway and No signs of Nausea or vomiting  Post-op Vital Signs: Reviewed and stable  Complications: No apparent anesthesia complications

## 2016-07-16 NOTE — Anesthesia Preprocedure Evaluation (Signed)
Anesthesia Evaluation    Airway Mallampati: II  TM Distance: >3 FB Neck ROM: Full    Dental no notable dental hx.    Pulmonary Current Smoker,    Pulmonary exam normal breath sounds clear to auscultation       Cardiovascular + Peripheral Vascular Disease  negative cardio ROS Normal cardiovascular exam Rhythm:Regular Rate:Normal     Neuro/Psych Seizures -,  PSYCHIATRIC DISORDERS Anxiety Depression    GI/Hepatic GERD  ,(+)     substance abuse  alcohol use,   Endo/Other  negative endocrine ROS  Renal/GU negative Renal ROS     Musculoskeletal  (+) Arthritis , Fibromyalgia -  Abdominal   Peds  Hematology negative hematology ROS (+)   Anesthesia Other Findings   Reproductive/Obstetrics                             Anesthesia Physical Anesthesia Plan  ASA: II  Anesthesia Plan: General   Post-op Pain Management:    Induction: Intravenous  PONV Risk Score and Plan: Propofol  Airway Management Planned:   Additional Equipment:   Intra-op Plan:   Post-operative Plan: Extubation in OR  Informed Consent: I have reviewed the patients History and Physical, chart, labs and discussed the procedure including the risks, benefits and alternatives for the proposed anesthesia with the patient or authorized representative who has indicated his/her understanding and acceptance.   Dental advisory given  Plan Discussed with: CRNA  Anesthesia Plan Comments:         Anesthesia Quick Evaluation

## 2016-07-16 NOTE — H&P (Addendum)
Christie Lame, MD [address] [place], [place] [address] Phone:[phone] Fax : [phone]  Primary Christie Physician:  Wardell Honour, MD Primary Gastroenterologist:  Dr. Allen Norris  Pre-Procedure History & Physical: HPI:  [name] is a 56 y.o. female is here for an colonoscopy.   Past Medical History:  Diagnosis Date  . Allergic rhinitis, cause unspecified   . Anxiety   . Depressive disorder, not elsewhere classified   . Disturbance of skin sensation   . Diverticulosis of colon (without mention of hemorrhage)   . Fibromyalgia 06/08/2013   rheumatology consultation [facility].  Marland Kitchen GERD (gastroesophageal reflux disease)   . Internal hemorrhoids without mention of complication   . Myalgia and myositis, unspecified   . Neuralgia, neuritis, and radiculitis, unspecified   . Neurogenic bladder, NOS   . Other abnormal glucose   . Pain in joint, shoulder region   . Personal history of alcoholism ([place])   . Pure hypercholesterolemia   . Seizures ([place])   . Tobacco use disorder   . Unspecified venous (peripheral) insufficiency     Past Surgical History:  Procedure Laterality Date  . BREAST ENHANCEMENT SURGERY    . COLONOSCOPY  11/04/2008   [facility]: repeat in 5 years.  Barrie Lyme IMPLANT PLACEMENT    . TEMPOROMANDIBULAR JOINT SURGERY    . VAGINAL HYSTERECTOMY  1996   endometriosis;  ovaries removed.    Prior to Admission medications   Medication Sig Start Date End Date Taking? Authorizing Provider  aspirin 81 MG tablet Take 81 mg by mouth daily.   Yes [provider]  cholecalciferol (VITAMIN D) 1000 UNITS tablet Take 1,000 Units by mouth daily.   Yes [provider]  FLUoxetine (PROZAC) 20 MG tablet TAKE 2 TABLETS (40 MG TOTAL) BY MOUTH DAILY. 05/08/16  Yes Wardell Honour, MD  fluticasone [facility]) 50 MCG/ACT nasal spray Place 2 sprays into both nostrils daily. 05/08/16  Yes Wardell Honour, MD  furosemide (LASIX) 20 MG tablet Take 1 tablet (20 mg  total) by mouth daily. 05/08/16  Yes Wardell Honour, MD  HYDROcodone-acetaminophen (NORCO/VICODIN) 5-325 MG per tablet TAKE 1 TABLET BY MOUTH TWICE A DAY 10/09/13  Yes Marcial Pacas, MD  ipratropium (ATROVENT) 0.03 % nasal spray Place 2 sprays into the nose 2 (two) times daily. 11/29/14  Yes Wardell Honour, MD  LYRICA 150 MG capsule TAKE ONE CAPSULE BY MOUTH 4 TIMES A DAY Patient taking differently: TAKE ONE CAPSULE BY MOUTH 2 TIMES A DAY 10/06/13  Yes Marcial Pacas, MD  morphine (MS CONTIN) 15 MG 12 hr tablet Take 15 mg by mouth every 12 (twelve) hours.   Yes [provider]  MOVANTIK 25 MG TABS tablet Take 25 mg by mouth daily. 06/13/16  Yes [provider]  polyethylene glycol-electrolytes (GAVILYTE-N WITH FLAVOR PACK) 420 g solution Drink one 8 oz glass every 20 mins until stools are clear starting at 5:00pm on Sunday. 07/12/16  Yes Christie Lame, MD  potassium chloride (KLOR-CON) 20 MEQ packet Take 20 mEq by mouth daily. 05/08/16  Yes Wardell Honour, MD  simvastatin (ZOCOR) 40 MG tablet TAKE 1 TABLET (40 MG TOTAL) BY MOUTH AT BEDTIME. 05/08/16  Yes Wardell Honour, MD  omeprazole (PRILOSEC) 20 MG capsule Take 20 mg by mouth 2 (two) times daily.    [provider]  propranolol (INDERAL) 20 MG tablet Take 20 mg by mouth 3 (three) times daily.    [provider]  Allergies as of 07/12/2016 - Review Complete 07/12/2016  Allergen Reaction Noted  . Septra ds [sulfamethoxazole-trimethoprim] Hives 10/04/2011  . Tape Rash 03/10/2013    Family History  Problem Relation Age of Onset  . Asthma Mother   . COPD Mother   . Hyperlipidemia Father   . Parkinsonism Father   . Cancer Sister        skin, fingers, spread to bone  . Stroke Unknown     Social History   Social History  . Marital status: Married    Spouse name: N/A  . Number of children: 2  . Years of education: 12   Occupational History  . unemployed     not looking for work since 2008   Social History  Main Topics  . Smoking status: Current Every Day Smoker    Packs/day: 1.00    Years: 30.00    Types: Cigarettes  . Smokeless tobacco: Never Used  . Alcohol use No     Comment: recovering alcoholic [id number]  . Drug use: No  . Sexual activity: Yes    Birth control/ protection: Post-menopausal, Surgical   Other Topics Concern  . Not on file   Social History Narrative   Marital status: married x 9 years to Ferdinand; happily married; third marriage.  No abuse.       Children:  2 children (34, 30); 1 stepdaughter (22); 6 grandchildren.       Lives: with husband, mother-in-law       Employment: unemployed; no disability       Tobacco: 1 ppd x 35 years.       Alcohol: in recovery; last alcohol use in 2008.         Drugs:  None       Exercise: [employer] every other day in 2018.      Always uses seat belts. Smoke alarm and carbon monoxide detectors in the home. Guns in the home,stored in locked cabinet.  Pets: dog. Well balanced diet, caffeine use Coffee. 3 servings/day redbull trying to stop. Living Will pat does not have living will, DOES have HCPOA. Organ Donor:YES.    Review of Systems: See HPI, otherwise negative ROS  Physical Exam: BP 120/79   Pulse 66   Temp 98.1 F (36.7 C) (Temporal)   Ht 5\' 7"  (1.702 m)   Wt 170 lb (77.1 kg)   SpO2 95%   BMI 26.63 kg/m  General:   Alert,  pleasant and cooperative in NAD Head:  Normocephalic and atraumatic. Neck:  Supple; no masses or thyromegaly. Lungs:  Clear throughout to auscultation.    Heart:  Regular rate and rhythm. Abdomen:  Soft, nontender and nondistended. Normal bowel sounds, without guarding, and without rebound.   Neurologic:  Alert and  oriented x4;  grossly normal neurologically.  Impression/Plan: Christie Benjamin is here for an colonoscopy to be performed for family history of colon polyps.  Risks, benefits, limitations, and alternatives regarding  colonoscopy have been reviewed with the patient.  Questions have  been answered.  All parties agreeable.   Christie Lame, MD  07/16/2016, 11:18 AM

## 2016-07-16 NOTE — Op Note (Signed)
[facility] Patient Name: Christie Benjamin Procedure Date: 07/16/2016 11:14 AM MRN: [id number] Account #: [id number] Date of Birth: 1960/08/12 Admit Type: Outpatient Age: 56 Room: [facility] OR ROOM 01 Gender: Female Note Status: Finalized Procedure:            Colonoscopy Indications:          Family history of advanced adenoma of the colon in a                        first-degree relative Providers:            Lucilla Lame MD, MD Referring MD:         Renette Butters. Tamala Julian, MD (Referring MD) Medicines:            Propofol per Anesthesia Complications:        No immediate complications. Procedure:            Pre-Anesthesia Assessment:                       - Prior to the procedure, a History and Physical was                        performed, and patient medications and allergies were                        reviewed. The patient's tolerance of previous                        anesthesia was also reviewed. The risks and benefits of                        the procedure and the sedation options and risks were                        discussed with the patient. All questions were                        answered, and informed consent was obtained. Prior                        Anticoagulants: The patient has taken no previous                        anticoagulant or antiplatelet agents. ASA Grade                        Assessment: II - A patient with mild systemic disease.                        After reviewing the risks and benefits, the patient was                        deemed in satisfactory condition to undergo the                        procedure.                       After obtaining informed consent, the colonoscope was  passed under direct vision. Throughout the procedure,                        the patient's blood pressure, pulse, and oxygen                        saturations were monitored continuously. The [place] [id number]) was introduced through the                        anus and advanced to the the cecum, identified by                        appendiceal orifice and ileocecal valve. The                        colonoscopy was performed without difficulty. The                        patient tolerated the procedure well. The quality of                        the bowel preparation was good. Findings:      The perianal and digital rectal examinations were normal.      A 5 mm polyp was found in the transverse colon. The polyp was sessile.       The polyp was removed with a cold snare. Resection and retrieval were       complete. Impression:           - One 5 mm polyp in the transverse colon, removed with                        a cold snare. Resected and retrieved. Recommendation:       - Discharge patient to home.                       - Resume previous diet.                       - Continue present medications.                       - Repeat colonoscopy in 5 years for surveillance. Procedure Code(s):    --- Professional ---                       [phone], Colonoscopy, flexible; with removal of tumor(s),                        polyp(s), or other lesion(s) by snare technique Diagnosis Code(s):    --- Professional ---                       Z83.71, Family history of colonic polyps                       D12.3, Benign neoplasm of transverse colon (hepatic                        flexure or splenic flexure) CPT copyright 2016 American Medical Association. All rights reserved. The codes documented  in this report are preliminary and upon coder review may  be revised to meet current compliance requirements. Lucilla Lame MD, MD 07/16/2016 11:50:01 AM This report has been signed electronically. Number of Addenda: 0 Note Initiated On: 07/16/2016 11:14 AM Scope Withdrawal Time: 0 hours 9 minutes 32 seconds  Total Procedure Duration: 0 hours 17 minutes 19 seconds       [facility]

## 2016-07-16 NOTE — Anesthesia Postprocedure Evaluation (Signed)
Anesthesia Post Note  Patient: Christie Benjamin  Procedure(s) Performed: Procedure(s) (LRB): COLONOSCOPY WITH PROPOFOL (N/A) POLYPECTOMY  Patient location during evaluation: PACU Anesthesia Type: General Level of consciousness: awake and alert Pain management: pain level controlled Vital Signs Assessment: post-procedure vital signs reviewed and stable Respiratory status: spontaneous breathing, nonlabored ventilation, respiratory function stable and patient connected to nasal cannula oxygen Cardiovascular status: blood pressure returned to baseline and stable Postop Assessment: no signs of nausea or vomiting Anesthetic complications: no    Shavana Calder C

## 2016-07-16 NOTE — Anesthesia Procedure Notes (Signed)
Performed by: Laydon Martis Pre-anesthesia Checklist: Patient identified, Emergency Drugs available, Suction available, Timeout performed and Patient being monitored Patient Re-evaluated:Patient Re-evaluated prior to induction Oxygen Delivery Method: Nasal cannula Placement Confirmation: positive ETCO2       

## 2016-07-18 ENCOUNTER — Encounter: Payer: Self-pay | Admitting: Gastroenterology

## 2016-07-25 ENCOUNTER — Encounter: Payer: Self-pay | Admitting: Family Medicine

## 2016-07-25 ENCOUNTER — Other Ambulatory Visit: Payer: Self-pay | Admitting: Family Medicine

## 2016-07-25 DIAGNOSIS — I878 Other specified disorders of veins: Secondary | ICD-10-CM

## 2016-07-26 ENCOUNTER — Other Ambulatory Visit: Payer: Self-pay

## 2016-07-26 DIAGNOSIS — I878 Other specified disorders of veins: Secondary | ICD-10-CM

## 2016-07-30 ENCOUNTER — Other Ambulatory Visit: Payer: Self-pay

## 2016-07-30 ENCOUNTER — Telehealth: Payer: Self-pay | Admitting: Gastroenterology

## 2016-07-30 MED ORDER — LUBIPROSTONE 24 MCG PO CAPS: 24.0000 ug | ORAL_CAPSULE | Freq: Two times a day (BID) | ORAL | 6 refills | Status: DC

## 2016-07-30 NOTE — Telephone Encounter (Signed)
Amitiza 50mcg has been sent to pt's pharmacy. Left vm letting pt know this has been done.

## 2016-07-30 NOTE — Telephone Encounter (Signed)
[facility] [place] Patient was given a sample and needs to get it filled

## 2016-11-13 ENCOUNTER — Ambulatory Visit: Payer: Commercial Managed Care - PPO | Admitting: Family Medicine

## 2016-12-05 ENCOUNTER — Other Ambulatory Visit: Payer: Self-pay

## 2016-12-05 ENCOUNTER — Encounter: Payer: Self-pay | Admitting: Family Medicine

## 2016-12-05 ENCOUNTER — Ambulatory Visit (INDEPENDENT_AMBULATORY_CARE_PROVIDER_SITE_OTHER): Payer: Commercial Managed Care - PPO | Admitting: Family Medicine

## 2016-12-05 VITALS — BP 128/78 | HR 83 | Temp 97.8°F | Resp 16 | Ht 68.11 in | Wt 177.0 lb

## 2016-12-05 DIAGNOSIS — R269 Unspecified abnormalities of gait and mobility: Secondary | ICD-10-CM

## 2016-12-05 DIAGNOSIS — R7309 Other abnormal glucose: Secondary | ICD-10-CM

## 2016-12-05 DIAGNOSIS — F418 Other specified anxiety disorders: Secondary | ICD-10-CM

## 2016-12-05 DIAGNOSIS — E7800 Pure hypercholesterolemia, unspecified: Secondary | ICD-10-CM

## 2016-12-05 DIAGNOSIS — G894 Chronic pain syndrome: Secondary | ICD-10-CM

## 2016-12-05 DIAGNOSIS — R339 Retention of urine, unspecified: Secondary | ICD-10-CM | POA: Diagnosis not present

## 2016-12-05 DIAGNOSIS — M797 Fibromyalgia: Secondary | ICD-10-CM | POA: Diagnosis not present

## 2016-12-05 DIAGNOSIS — R251 Tremor, unspecified: Secondary | ICD-10-CM

## 2016-12-05 DIAGNOSIS — M47816 Spondylosis without myelopathy or radiculopathy, lumbar region: Secondary | ICD-10-CM | POA: Diagnosis not present

## 2016-12-05 DIAGNOSIS — Z1231 Encounter for screening mammogram for malignant neoplasm of breast: Secondary | ICD-10-CM

## 2016-12-05 DIAGNOSIS — Z1239 Encounter for other screening for malignant neoplasm of breast: Secondary | ICD-10-CM

## 2016-12-05 MED ORDER — FLUOXETINE HCL 20 MG PO TABS: 40.0000 mg | ORAL_TABLET | Freq: Every day | ORAL | 1 refills | Status: DC

## 2016-12-05 MED ORDER — SIMVASTATIN 40 MG PO TABS: ORAL_TABLET | ORAL | 1 refills | Status: DC

## 2016-12-05 NOTE — Progress Notes (Signed)
Subjective:    Patient ID: Christie Benjamin, female    DOB: [birth year], 56 y.o.   MRN: [id number]  12/05/2016  Hyperlipidemia (6 month follow-up) and Depression (with Anxiety)    HPI This 56 y.o. female presents for six month follow-up of hypercholesterolemia, anxiety/depression, fibromyalgia.  No changes to management made at last visit.  Dyspareunia: marital strain. Pain with intercourse.  No interest.  No sex drive. Chronic issue.  Discomfort with intercourse for 5-10 years.  Suffers afterwards.  Legs have horrible pain.    Anxiety and depression: taking Prozac 40mg  daily.   Tremor: R>L: marital strain due to dyspareunia.  Grandson in [place]. Prescribed Propanolol for tremors PRN.  Last follow-up 01/2016 with Noni Saupe.  No Propanolol in past three week.  No stressors.     BP Readings from Last 3 Encounters:  12/05/16 128/78  07/16/16 103/72  07/12/16 126/66   Wt Readings from Last 3 Encounters:  12/05/16 177 lb (80.3 kg)  07/16/16 170 lb (77.1 kg)  07/12/16 175 lb (79.4 kg)   Immunization History  Administered Date(s) Administered  . Td 01/09/2008    Review of Systems  Constitutional: Negative for chills, diaphoresis, fatigue and fever.  Eyes: Negative for visual disturbance.  Respiratory: Negative for cough and shortness of breath.   Cardiovascular: Positive for leg swelling. Negative for chest pain and palpitations.  Gastrointestinal: Negative for abdominal pain, constipation, diarrhea, nausea and vomiting.  Endocrine: Negative for cold intolerance, heat intolerance, polydipsia, polyphagia and polyuria.  Musculoskeletal: Positive for arthralgias, back pain and myalgias.  Neurological: Positive for tremors. Negative for dizziness, seizures, syncope, facial asymmetry, speech difficulty, weakness, light-headedness, numbness and headaches.  Psychiatric/Behavioral: Positive for dysphoric mood. Negative for sleep disturbance and suicidal ideas. The  patient is nervous/anxious.     Past Medical History:  Diagnosis Date  . Allergic rhinitis, cause unspecified   . Anxiety   . Depressive disorder, not elsewhere classified   . Disturbance of skin sensation   . Diverticulosis of colon (without mention of hemorrhage)   . Fibromyalgia 06/08/2013   rheumatology consultation [facility].  Marland Kitchen GERD (gastroesophageal reflux disease)   . Internal hemorrhoids without mention of complication   . Myalgia and myositis, unspecified   . Neuralgia, neuritis, and radiculitis, unspecified   . Neurogenic bladder, NOS   . Other abnormal glucose   . Pain in joint, shoulder region   . Personal history of alcoholism ([place])   . Pure hypercholesterolemia   . Seizures ([place])   . Tobacco use disorder   . Unspecified venous (peripheral) insufficiency    Past Surgical History:  Procedure Laterality Date  . BREAST ENHANCEMENT SURGERY    . COLONOSCOPY  11/04/2008   IH: repeat in 5 years.  . COLONOSCOPY WITH PROPOFOL N/A 07/16/2016   Procedure: COLONOSCOPY WITH PROPOFOL;  Surgeon: Lucilla Lame, MD;  Location: [place];  Service: Endoscopy;  Laterality: N/A;  . INTERSTIM IMPLANT PLACEMENT    . POLYPECTOMY  07/16/2016   Procedure: POLYPECTOMY;  Surgeon: Lucilla Lame, MD;  Location: [facility];  Service: Endoscopy;;  . TEMPOROMANDIBULAR JOINT SURGERY    . VAGINAL HYSTERECTOMY  1996   endometriosis;  ovaries removed.   Allergies  Allergen Reactions  . Sulfa Antibiotics Hives  . Septra Ds [Sulfamethoxazole-Trimethoprim] Hives  . Tape Rash    "break out" "break out;" band-aids ok   Current Outpatient Medications on File Prior to Visit  Medication Sig Dispense Refill  . aspirin 81 MG tablet Take 81 mg  by mouth daily.    . cholecalciferol (VITAMIN D) 1000 UNITS tablet Take 1,000 Units by mouth daily.    . fluticasone (FLONASE) 50 MCG/ACT nasal spray Place 2 sprays into both nostrils daily. 48 g 3  . furosemide (LASIX) 20 MG tablet Take 1 tablet (20 mg  total) by mouth daily. 90 tablet 1  . HYDROcodone-acetaminophen (NORCO/VICODIN) 5-325 MG per tablet TAKE 1 TABLET BY MOUTH TWICE A DAY 60 tablet 0  . ipratropium (ATROVENT) 0.03 % nasal spray Place 2 sprays into the nose 2 (two) times daily. 30 mL 0  . lubiprostone (AMITIZA) 24 MCG capsule Take 1 capsule (24 mcg total) by mouth 2 (two) times daily with a meal. 60 capsule 6  . LYRICA 150 MG capsule TAKE ONE CAPSULE BY MOUTH 4 TIMES A DAY (Patient taking differently: TAKE ONE CAPSULE BY MOUTH 2 TIMES A DAY) 120 capsule 1  . morphine (MS CONTIN) 15 MG 12 hr tablet Take 15 mg by mouth every 12 (twelve) hours.    Marland Kitchen MOVANTIK 25 MG TABS tablet Take 25 mg by mouth daily.  5  . omeprazole (PRILOSEC) 20 MG capsule Take 20 mg by mouth 2 (two) times daily.    . potassium chloride (KLOR-CON) 20 MEQ packet Take 20 mEq by mouth daily. 90 tablet 3  . propranolol (INDERAL) 20 MG tablet Take 20 mg by mouth 3 (three) times daily.     No current facility-administered medications on file prior to visit.    Social History   Socioeconomic History  . Marital status: Married    Spouse name: Not on file  . Number of children: 2  . Years of education: 61  . Highest education level: Not on file  Social Needs  . Financial resource strain: Not on file  . Food insecurity - worry: Not on file  . Food insecurity - inability: Not on file  . Transportation needs - medical: Not on file  . Transportation needs - non-medical: Not on file  Occupational History  . Occupation: unemployed    Comment: not looking for work since 2008  Tobacco Use  . Smoking status: Current Every Day Smoker    Packs/day: 1.00    Years: 30.00    Pack years: 30.00    Types: Cigarettes  . Smokeless tobacco: Never Used  Substance and Sexual Activity  . Alcohol use: No    Comment: recovering alcoholic 0086  . Drug use: No  . Sexual activity: Yes    Birth control/protection: Post-menopausal, Surgical  Other Topics Concern  . Not on file    Social History Narrative   Marital status: married x 9 years to [place]; happily married; third marriage.  No abuse.       Children:  2 children (34, 30); 1 stepdaughter (22); 6 grandchildren.       Lives: with husband, mother-in-law       Employment: unemployed; no disability       Tobacco: 1 ppd x 35 years.       Alcohol: in recovery; last alcohol use in 2008.         Drugs:  None       Exercise: [employer] every other day in 2018.      Always uses seat belts. Smoke alarm and carbon monoxide detectors in the home. Guns in the home,stored in locked cabinet.  Pets: dog. Well balanced diet, caffeine use Coffee. 3 servings/day redbull trying to stop. Living Will pat does not have living will,  DOES have HCPOA. Organ Donor:YES.   Family History  Problem Relation Age of Onset  . Asthma Mother   . COPD Mother   . Hyperlipidemia Father   . Parkinsonism Father   . Cancer Sister        skin, fingers, spread to bone  . Stroke Unknown        Objective:    BP 128/78   Pulse 83   Temp 97.8 F (36.6 C) (Oral)   Resp 16   Ht 5' 8.11" (1.73 m)   Wt 177 lb (80.3 kg)   SpO2 95%   BMI 26.83 kg/m  Physical Exam  Constitutional: She is oriented to person, place, and time. She appears well-developed and well-nourished. No distress.  HENT:  Head: Normocephalic and atraumatic.  Right Ear: External ear normal.  Left Ear: External ear normal.  Nose: Nose normal.  Mouth/Throat: Oropharynx is clear and moist.  Eyes: Conjunctivae and EOM are normal. Pupils are equal, round, and reactive to light.  Neck: Normal range of motion. Neck supple. Carotid bruit is not present. No thyromegaly present.  Cardiovascular: Normal rate, regular rhythm, normal heart sounds and intact distal pulses. Exam reveals no gallop and no friction rub.  No murmur heard. Pulmonary/Chest: Effort normal and breath sounds normal. She has no wheezes. She has no rales.  Abdominal: Soft. Bowel sounds are normal. She exhibits  no distension and no mass. There is no tenderness. There is no rebound and no guarding.  Lymphadenopathy:    She has no cervical adenopathy.  Neurological: She is alert and oriented to person, place, and time. She has normal strength. She displays tremor. No cranial nerve deficit or sensory deficit.  Skin: Skin is warm and dry. No rash noted. She is not diaphoretic. No erythema. No pallor.  Psychiatric: She has a normal mood and affect. Her behavior is normal.   No results found. Depression screen [facility] 2/9 12/05/2016 05/08/2016 11/08/2015 11/29/2014 09/06/2014  Decreased Interest 0 0 0 0 0  Down, Depressed, Hopeless 0 0 0 0 0  PHQ - 2 Score 0 0 0 0 0   Fall Risk  12/05/2016 05/08/2016 11/08/2015 11/29/2014 04/14/2014  Falls in the past year? Yes Yes No Yes Yes  Comment - - - - tripped  Number falls in past yr: 2 or more 2 or more - 2 or more -  Injury with Fall? No No - No -  Comment - - - - -        Assessment & Plan:   1. Pure hypercholesterolemia   2. 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 Urinary retention   5. Abnormality of gait   6. Chronic pain syndrome   7. Fibromyalgia   8. Other abnormal glucose   9. Breast cancer screening   10. Tremor     Worsening anxiety with depression due to marital strain.  Increase Prozac to 60 mg daily.  Counseling provided during visit today.  Consider psychotherapy to help manage marital issues at this current time.  Hypercholesterolemia well controlled.  Obtain labs.  Continue current medication.  Patient suffers with fibromyalgia and chronic pain syndrome as well as spondylosis of lumbar spine.  Managed by pain management.  Follow very closely with pain management.  Worsening tremor with increased anxiety.  Father with history of Parkinson's disease.  Patient underwent evaluation by neurology for tremor in January 2018.  Patient was prescribed propanolol for as needed  use for  increased tremor.  Recommend using Panalok 3 times daily as prescribed.  Also recommend contacting neurologist for follow-up appointment/tremors acutely worsened.  Nonfocal neurological exam during visit today.  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  No resting tremor appreciated during examination.  Recommend weight loss, exercise for 30-60 minutes five days per week; recommend 1200 kcal restriction per day with a minimum of 60 grams of protein per day.   Orders Placed This Encounter  Procedures  . MM DIGITAL SCREENING BILATERAL    Standing Status:   Future    Standing Expiration Date:   02/04/2018    Order Specific Question:   Reason for Exam (SYMPTOM  OR DIAGNOSIS REQUIRED)    Answer:   screening for breast cancer    Order Specific Question:   Is the patient pregnant?    Answer:   No    Order Specific Question:   Preferred imaging location?    Answer:   [facility]  . CBC with Differential/Platelet  . Comprehensive metabolic panel    Order Specific Question:   Has the patient fasted?    Answer:   No  . Hemoglobin A1c  . Lipid panel    Order Specific Question:   Has the patient fasted?    Answer:   No  . TSH   Meds ordered this encounter  Medications  . FLUoxetine (PROZAC) 20 MG tablet    Sig: Take 2-3 tablets (40-60 mg total) by mouth daily.    Dispense:  270 tablet    Refill:  1  . simvastatin (ZOCOR) 40 MG tablet    Sig: TAKE 1 TABLET (40 MG TOTAL) BY MOUTH AT BEDTIME.    Dispense:  90 tablet    Refill:  1    Return in about 6 months (around 06/04/2017) for complete physical examiniation.   Quavion Boule Elayne Guerin, M.D. Primary Care at [facility] [address] [place], [place]  [address] [phone] phone [phone] fax

## 2016-12-05 NOTE — Patient Instructions (Addendum)
  Increase Prozac to three daily. Start taking Propanolol 2-3 times daily for tremor. Call neurologist for follow-up appointment for tremor.   IF you received an x-ray today, you will receive an invoice from [facility]. Please contact [facility] at [phone] with questions or concerns regarding your invoice.   IF you received labwork today, you will receive an invoice from [facility]. Please contact [facility] at [phone] with questions or concerns regarding your invoice.   Our billing staff will not be able to assist you with questions regarding bills from these companies.  You will be contacted with the lab results as soon as they are available. The fastest way to get your results is to activate your My Chart account. Instructions are located on the last page of this paperwork. If you have not heard from [place] regarding the results in 2 weeks, please contact this office.

## 2016-12-06 LAB — CBC WITH DIFFERENTIAL/PLATELET
BASOS: 0 %
Basophils Absolute: 0 10*3/uL (ref 0.0–0.2)
EOS (ABSOLUTE): 0.1 10*3/uL (ref 0.0–0.4)
EOS: 1 %
HEMATOCRIT: 37.1 % (ref 34.0–46.6)
HEMOGLOBIN: 12.7 g/dL (ref 11.1–15.9)
IMMATURE GRANS (ABS): 0 10*3/uL (ref 0.0–0.1)
Immature Granulocytes: 0 %
LYMPHS: 30 %
Lymphocytes Absolute: 3.3 10*3/uL — ABNORMAL HIGH (ref 0.7–3.1)
MCH: 30 pg (ref 26.6–33.0)
MCHC: 34.2 g/dL (ref 31.5–35.7)
MCV: 88 fL (ref 79–97)
MONOCYTES: 5 %
Monocytes Absolute: 0.6 10*3/uL (ref 0.1–0.9)
NEUTROS ABS: 7 10*3/uL (ref 1.4–7.0)
Neutrophils: 64 %
Platelets: 263 10*3/uL (ref 150–379)
RBC: 4.23 x10E6/uL (ref 3.77–5.28)
RDW: 14.4 % (ref 12.3–15.4)
WBC: 10.9 10*3/uL — ABNORMAL HIGH (ref 3.4–10.8)

## 2016-12-06 LAB — COMPREHENSIVE METABOLIC PANEL
A/G RATIO: 2.6 — AB (ref 1.2–2.2)
ALBUMIN: 4.4 g/dL (ref 3.5–5.5)
ALT: 14 IU/L (ref 0–32)
AST: 20 IU/L (ref 0–40)
Alkaline Phosphatase: 72 IU/L (ref 39–117)
BILIRUBIN TOTAL: 0.2 mg/dL (ref 0.0–1.2)
BUN / CREAT RATIO: 18 (ref 9–23)
BUN: 12 mg/dL (ref 6–24)
CHLORIDE: 102 mmol/L (ref 96–106)
CO2: 25 mmol/L (ref 20–29)
Calcium: 9 mg/dL (ref 8.7–10.2)
Creatinine, Ser: 0.68 mg/dL (ref 0.57–1.00)
GFR, EST AFRICAN AMERICAN: 115 mL/min/{1.73_m2} (ref >59)
GFR, EST NON AFRICAN AMERICAN: 99 mL/min/{1.73_m2} (ref >59)
GLOBULIN, TOTAL: 1.7 g/dL (ref 1.5–4.5)
Glucose: 79 mg/dL (ref 65–99)
POTASSIUM: 4.3 mmol/L (ref 3.5–5.2)
Sodium: 140 mmol/L (ref 134–144)
TOTAL PROTEIN: 6.1 g/dL (ref 6.0–8.5)

## 2016-12-06 LAB — LIPID PANEL
CHOL/HDL RATIO: 2.5 ratio (ref 0.0–4.4)
Cholesterol, Total: 145 mg/dL (ref 100–199)
HDL: 57 mg/dL (ref >39)
LDL CALC: 68 mg/dL (ref 0–99)
TRIGLYCERIDES: 102 mg/dL (ref 0–149)
VLDL CHOLESTEROL CAL: 20 mg/dL (ref 5–40)

## 2016-12-06 LAB — HEMOGLOBIN A1C
Est. average glucose Bld gHb Est-mCnc: 117 mg/dL
Hgb A1c MFr Bld: 5.7 % — ABNORMAL HIGH (ref 4.8–5.6)

## 2016-12-06 LAB — TSH: TSH: 0.666 u[IU]/mL (ref 0.450–4.500)

## 2017-03-10 ENCOUNTER — Other Ambulatory Visit: Payer: Self-pay | Admitting: Family Medicine

## 2017-03-10 DIAGNOSIS — I878 Other specified disorders of veins: Secondary | ICD-10-CM

## 2017-04-22 ENCOUNTER — Other Ambulatory Visit: Payer: Self-pay | Admitting: Gastroenterology

## 2017-04-22 DIAGNOSIS — K5901 Slow transit constipation: Secondary | ICD-10-CM

## 2017-05-02 ENCOUNTER — Other Ambulatory Visit: Payer: Self-pay

## 2017-06-03 ENCOUNTER — Encounter: Payer: Self-pay | Admitting: Family Medicine

## 2017-06-10 ENCOUNTER — Encounter: Payer: Commercial Managed Care - PPO | Admitting: Family Medicine

## 2017-06-11 ENCOUNTER — Encounter: Payer: Self-pay | Admitting: Family Medicine

## 2017-06-12 ENCOUNTER — Ambulatory Visit (INDEPENDENT_AMBULATORY_CARE_PROVIDER_SITE_OTHER): Payer: Commercial Managed Care - PPO | Admitting: Family Medicine

## 2017-06-12 ENCOUNTER — Encounter: Payer: Self-pay | Admitting: Family Medicine

## 2017-06-12 ENCOUNTER — Other Ambulatory Visit: Payer: Self-pay

## 2017-06-12 VITALS — BP 118/70 | HR 77 | Temp 98.0°F | Resp 16 | Ht 67.0 in | Wt 182.0 lb

## 2017-06-12 DIAGNOSIS — Z136 Encounter for screening for cardiovascular disorders: Secondary | ICD-10-CM

## 2017-06-12 DIAGNOSIS — Z0000 Encounter for general adult medical examination without abnormal findings: Secondary | ICD-10-CM | POA: Diagnosis not present

## 2017-06-12 DIAGNOSIS — R339 Retention of urine, unspecified: Secondary | ICD-10-CM | POA: Diagnosis not present

## 2017-06-12 DIAGNOSIS — R7309 Other abnormal glucose: Secondary | ICD-10-CM | POA: Diagnosis not present

## 2017-06-12 DIAGNOSIS — Z8371 Family history of colonic polyps: Secondary | ICD-10-CM

## 2017-06-12 DIAGNOSIS — Z720 Tobacco use: Secondary | ICD-10-CM

## 2017-06-12 DIAGNOSIS — E7800 Pure hypercholesterolemia, unspecified: Secondary | ICD-10-CM

## 2017-06-12 DIAGNOSIS — J301 Allergic rhinitis due to pollen: Secondary | ICD-10-CM

## 2017-06-12 DIAGNOSIS — M797 Fibromyalgia: Secondary | ICD-10-CM

## 2017-06-12 DIAGNOSIS — Z1239 Encounter for other screening for malignant neoplasm of breast: Secondary | ICD-10-CM

## 2017-06-12 DIAGNOSIS — F418 Other specified anxiety disorders: Secondary | ICD-10-CM

## 2017-06-12 DIAGNOSIS — I878 Other specified disorders of veins: Secondary | ICD-10-CM

## 2017-06-12 DIAGNOSIS — M47816 Spondylosis without myelopathy or radiculopathy, lumbar region: Secondary | ICD-10-CM

## 2017-06-12 DIAGNOSIS — Z83719 Family history of colon polyps, unspecified: Secondary | ICD-10-CM

## 2017-06-12 DIAGNOSIS — F5104 Psychophysiologic insomnia: Secondary | ICD-10-CM

## 2017-06-12 DIAGNOSIS — E7439 Other disorders of intestinal carbohydrate absorption: Secondary | ICD-10-CM

## 2017-06-12 DIAGNOSIS — G894 Chronic pain syndrome: Secondary | ICD-10-CM

## 2017-06-12 LAB — POCT URINALYSIS DIP (MANUAL ENTRY)
BILIRUBIN UA: NEGATIVE
BILIRUBIN UA: NEGATIVE mg/dL
Glucose, UA: NEGATIVE mg/dL
Nitrite, UA: NEGATIVE
PROTEIN UA: NEGATIVE mg/dL
SPEC GRAV UA: 1.025 (ref 1.010–1.025)
Urobilinogen, UA: 0.2 E.U./dL
pH, UA: 6 (ref 5.0–8.0)

## 2017-06-12 MED ORDER — POTASSIUM CHLORIDE 20 MEQ PO PACK: 20.0000 meq | PACK | Freq: Every day | ORAL | 3 refills | Status: AC

## 2017-06-12 MED ORDER — BUSPIRONE HCL 5 MG PO TABS: 5.0000 mg | ORAL_TABLET | Freq: Three times a day (TID) | ORAL | 5 refills | Status: AC

## 2017-06-12 MED ORDER — FUROSEMIDE 20 MG PO TABS: 20.0000 mg | ORAL_TABLET | Freq: Every day | ORAL | 1 refills | Status: AC

## 2017-06-12 MED ORDER — SIMVASTATIN 40 MG PO TABS: ORAL_TABLET | ORAL | 1 refills | Status: AC

## 2017-06-12 MED ORDER — FLUOXETINE HCL 20 MG PO TABS: 40.0000 mg | ORAL_TABLET | Freq: Every day | ORAL | 1 refills | Status: AC

## 2017-06-12 MED ORDER — FLUTICASONE PROPIONATE 50 MCG/ACT NA SUSP: 2.0000 | Freq: Every day | NASAL | 3 refills | Status: AC

## 2017-06-12 NOTE — Patient Instructions (Addendum)
IF you received an x-ray today, you will receive an invoice from [facility]. Please contact [facility] at [phone] with questions or concerns regarding your invoice.   IF you received labwork today, you will receive an invoice from [facility]. Please contact [facility] at [phone] with questions or concerns regarding your invoice.   Our billing staff will not be able to assist you with questions regarding bills from these companies.  You will be contacted with the lab results as soon as they are available. The fastest way to get your results is to activate your My Chart account. Instructions are located on the last page of this paperwork. If you have not heard from [place] regarding the results in 2 weeks, please contact this office.      Preventive Care 40-64 Years, Female Preventive care refers to lifestyle choices and visits with your health care provider that can promote health and wellness. What does preventive care include?  A yearly physical exam. This is also called an annual well check.  Dental exams once or twice a year.  Routine eye exams. Ask your health care provider how often you should have your eyes checked.  Personal lifestyle choices, including: ? Daily care of your teeth and gums. ? Regular physical activity. ? Eating a healthy diet. ? Avoiding tobacco and drug use. ? Limiting alcohol use. ? Practicing safe sex. ? Taking low-dose aspirin daily starting at age 34. ? Taking vitamin and mineral supplements as recommended by your health care provider. What happens during an annual well check? The services and screenings done by your health care provider during your annual well check will depend on your age, overall health, lifestyle risk factors, and family history of disease. Counseling Your health care provider may ask you questions about your:  Alcohol use.  Tobacco use.  Drug use.  Emotional well-being.  Home and relationship  well-being.  Sexual activity.  Eating habits.  Work and work Statistician.  Method of birth control.  Menstrual cycle.  Pregnancy history.  Screening You may have the following tests or measurements:  Height, weight, and BMI.  Blood pressure.  Lipid and cholesterol levels. These may be checked every 5 years, or more frequently if you are over 65 years old.  Skin check.  Lung cancer screening. You may have this screening every year starting at age 102 if you have a 30-pack-year history of smoking and currently smoke or have quit within the past 15 years.  Fecal occult blood test (FOBT) of the stool. You may have this test every year starting at age 33.  Flexible sigmoidoscopy or colonoscopy. You may have a sigmoidoscopy every 5 years or a colonoscopy every 10 years starting at age 23.  Hepatitis C blood test.  Hepatitis B blood test.  Sexually transmitted disease (STD) testing.  Diabetes screening. This is done by checking your blood sugar (glucose) after you have not eaten for a while (fasting). You may have this done every 1-3 years.  Mammogram. This may be done every 1-2 years. Talk to your health care provider about when you should start having regular mammograms. This may depend on whether you have a family history of breast cancer.  BRCA-related cancer screening. This may be done if you have a family history of breast, ovarian, tubal, or peritoneal cancers.  Pelvic exam and Pap test. This may be done every 3 years starting at age 3. Starting at age 55, this may be done every 5 years if  you have a Pap test in combination with an HPV test.  Bone density scan. This is done to screen for osteoporosis. You may have this scan if you are at high risk for osteoporosis.  Discuss your test results, treatment options, and if necessary, the need for more tests with your health care provider. Vaccines Your health care provider may recommend certain vaccines, such  as:  Influenza vaccine. This is recommended every year.  Tetanus, diphtheria, and acellular pertussis (Tdap, Td) vaccine. You may need a Td booster every 10 years.  Varicella vaccine. You may need this if you have not been vaccinated.  Zoster vaccine. You may need this after age 77.  Measles, mumps, and rubella (MMR) vaccine. You may need at least one dose of MMR if you were born in 1957 or later. You may also need a second dose.  Pneumococcal 13-valent conjugate (PCV13) vaccine. You may need this if you have certain conditions and were not previously vaccinated.  Pneumococcal polysaccharide (PPSV23) vaccine. You may need one or two doses if you smoke cigarettes or if you have certain conditions.  Meningococcal vaccine. You may need this if you have certain conditions.  Hepatitis A vaccine. You may need this if you have certain conditions or if you travel or work in places where you may be exposed to hepatitis A.  Hepatitis B vaccine. You may need this if you have certain conditions or if you travel or work in places where you may be exposed to hepatitis B.  Haemophilus influenzae type b (Hib) vaccine. You may need this if you have certain conditions.  Talk to your health care provider about which screenings and vaccines you need and how often you need them. This information is not intended to replace advice given to you by your health care provider. Make sure you discuss any questions you have with your health care provider. Document Released: 01/21/2015 Document Revised: 09/14/2015 Document Reviewed: 10/26/2014 Elsevier Interactive Patient Education  [employer].

## 2017-06-12 NOTE — Progress Notes (Signed)
Subjective:    Patient ID: Christie Benjamin, female    DOB: [birth year], 58 y.o.   MRN: [id number]  06/12/2017  Annual Exam    HPI This 57 y.o. female presents for complete physical examination.   Visual Acuity Screening   Right eye Left eye Both eyes  Without correction:     With correction: 20/25 20/25 20/20     BP Readings from Last 3 Encounters:  06/12/17 118/70  12/05/16 128/78  07/16/16 103/72   Wt Readings from Last 3 Encounters:  06/12/17 182 lb (82.6 kg)  12/05/16 177 lb (80.3 kg)  07/16/16 170 lb (77.1 kg)   Immunization History  Administered Date(s) Administered  . Td 01/09/2008   Health Maintenance  Topic Date Due  . PAP SMEAR  05/01/2014  . MAMMOGRAM  08/02/2016  . INFLUENZA VACCINE  08/08/2017  . TETANUS/TDAP  01/08/2018  . COLONOSCOPY  07/16/2021  . Hepatitis C Screening  Completed  . HIV Screening  Completed   Review of Systems  Constitutional: Negative for activity change, appetite change, chills, diaphoresis, fatigue, fever and unexpected weight change.  HENT: Positive for dental problem. Negative for congestion, drooling, ear discharge, ear pain, facial swelling, hearing loss, mouth sores, nosebleeds, postnasal drip, rhinorrhea, sinus pressure, sneezing, sore throat, tinnitus, trouble swallowing and voice change.   Eyes: Negative for photophobia, pain, discharge, redness, itching and visual disturbance.  Respiratory: Negative for apnea, cough, choking, chest tightness, shortness of breath, wheezing and stridor.   Cardiovascular: Negative for chest pain, palpitations and leg swelling.  Gastrointestinal: Positive for constipation and diarrhea. Negative for abdominal distention, abdominal pain, anal bleeding, blood in stool, nausea, rectal pain and vomiting.  Endocrine: Negative for cold intolerance, heat intolerance, polydipsia, polyphagia and polyuria.  Genitourinary: Negative for decreased urine volume, difficulty urinating, dyspareunia, dysuria,  enuresis, flank pain, frequency, genital sores, hematuria, menstrual problem, pelvic pain, urgency, vaginal bleeding, vaginal discharge and vaginal pain.  Musculoskeletal: Positive for arthralgias, back pain, joint swelling, myalgias, neck pain and neck stiffness. Negative for gait problem.  Skin: Negative for color change, pallor, rash and wound.  Allergic/Immunologic: Positive for environmental allergies. Negative for food allergies and immunocompromised state.  Neurological: Positive for tremors, weakness and numbness. Negative for dizziness, seizures, syncope, facial asymmetry, speech difficulty, light-headedness and headaches.  Hematological: Negative for adenopathy. Does not bruise/bleed easily.  Psychiatric/Behavioral: Positive for sleep disturbance. Negative for agitation, behavioral problems, confusion, decreased concentration, dysphoric mood, hallucinations, self-injury and suicidal ideas. The patient is nervous/anxious. The patient is not hyperactive.     Past Medical History:  Diagnosis Date  . Allergic rhinitis, cause unspecified   . Anxiety   . Depressive disorder, not elsewhere classified   . Disturbance of skin sensation   . Diverticulosis of colon (without mention of hemorrhage)   . Fibromyalgia 06/08/2013   rheumatology consultation [facility].  Marland Kitchen GERD (gastroesophageal reflux disease)   . Internal hemorrhoids without mention of complication   . Myalgia and myositis, unspecified   . Neuralgia, neuritis, and radiculitis, unspecified   . Neurogenic bladder, NOS   . Other abnormal glucose   . Pain in joint, shoulder region   . Personal history of alcoholism ([place])   . Pure hypercholesterolemia   . Seizures ([place])   . Tobacco use disorder   . Unspecified venous (peripheral) insufficiency    Past Surgical History:  Procedure Laterality Date  . BREAST ENHANCEMENT SURGERY    . COLONOSCOPY  11/04/2008   IH: repeat in 5 years.  . COLONOSCOPY WITH  PROPOFOL N/A 07/16/2016   Procedure:  COLONOSCOPY WITH PROPOFOL;  Surgeon: Lucilla Lame, MD;  Location: [facility];  Service: Endoscopy;  Laterality: N/A;  . INTERSTIM IMPLANT PLACEMENT    . POLYPECTOMY  07/16/2016   Procedure: POLYPECTOMY;  Surgeon: Lucilla Lame, MD;  Location: [place];  Service: Endoscopy;;  . TEMPOROMANDIBULAR JOINT SURGERY    . VAGINAL HYSTERECTOMY  1996   endometriosis;  ovaries removed.   Allergies  Allergen Reactions  . Sulfa Antibiotics Hives  . Septra Ds [Sulfamethoxazole-Trimethoprim] Hives  . Tape Rash    "break out" "break out;" band-aids ok   Current Outpatient Medications on File Prior to Visit  Medication Sig Dispense Refill  . AMITIZA 24 MCG capsule TAKE 1 CAPSULE (24 MCG TOTAL) BY MOUTH 2 (TWO) TIMES DAILY WITH A MEAL. 60 capsule 5  . aspirin 81 MG tablet Take 81 mg by mouth daily.    . cholecalciferol (VITAMIN D) 1000 UNITS tablet Take 1,000 Units by mouth daily.    Marland Kitchen HYDROcodone-acetaminophen (NORCO/VICODIN) 5-325 MG per tablet TAKE 1 TABLET BY MOUTH TWICE A DAY 60 tablet 0  . LYRICA 150 MG capsule TAKE ONE CAPSULE BY MOUTH 4 TIMES A DAY (Patient taking differently: TAKE ONE CAPSULE BY MOUTH 2 TIMES A DAY) 120 capsule 1  . morphine (MS CONTIN) 15 MG 12 hr tablet Take 15 mg by mouth every 12 (twelve) hours.    Marland Kitchen MOVANTIK 25 MG TABS tablet Take 25 mg by mouth daily.  5  . omeprazole (PRILOSEC) 20 MG capsule Take 20 mg by mouth 2 (two) times daily.     No current facility-administered medications on file prior to visit.    Social History   Socioeconomic History  . Marital status: Married    Spouse name: Not on file  . Number of children: 2  . Years of education: 31  . Highest education level: Not on file  Occupational History  . Occupation: unemployed    Comment: not looking for work since 2008  Social Needs  . Financial resource strain: Not on file  . Food insecurity:    Worry: Not on file    Inability: Not on file  . Transportation needs:    Medical: Not  on file    Non-medical: Not on file  Tobacco Use  . Smoking status: Current Every Day Smoker    Packs/day: 1.00    Years: 30.00    Pack years: 30.00    Types: Cigarettes  . Smokeless tobacco: Never Used  Substance and Sexual Activity  . Alcohol use: No    Comment: recovering alcoholic [id number]  . Drug use: No  . Sexual activity: Yes    Birth control/protection: Post-menopausal, Surgical  Lifestyle  . Physical activity:    Days per week: Not on file    Minutes per session: Not on file  . Stress: Not on file  Relationships  . Social connections:    Talks on phone: Not on file    Gets together: Not on file    Attends religious service: Not on file    Active member of club or organization: Not on file    Attends meetings of clubs or organizations: Not on file    Relationship status: Not on file  . Intimate partner violence:    Fear of current or ex partner: Not on file    Emotionally abused: Not on file    Physically abused: Not on file    Forced sexual activity: Not  on file  Other Topics Concern  . Not on file  Social History Narrative   Marital status: married x 10 years to [place]; happily married; third marriage.  No abuse.       Children:  2 children (35, 31); 1 stepdaughter (23); 6 grandchildren.       Lives: with husband, mother-in-law       Employment: unemployed; no disability       Tobacco: 1 ppd x 35 years.       Alcohol: in recovery; last alcohol use in 2008.         Drugs:  None       Exercise: [employer] every other day in 2018.      Family History  Problem Relation Age of Onset  . Asthma Mother   . COPD Mother   . Hyperlipidemia Father   . Parkinsonism Father   . Cancer Sister        skin, fingers, spread to bone  . Stroke Unknown        Objective:    BP 118/70   Pulse 77   Temp 98 F (36.7 C) (Oral)   Resp 16   Ht 5\' 7"  (1.702 m)   Wt 182 lb (82.6 kg)   SpO2 96%   BMI 28.51 kg/m  Physical Exam  Constitutional: She is oriented to person,  place, and time. She appears well-developed and well-nourished. No distress.  HENT:  Head: Normocephalic and atraumatic.  Right Ear: External ear normal.  Left Ear: External ear normal.  Nose: Nose normal.  Mouth/Throat: Oropharynx is clear and moist.  Eyes: Pupils are equal, round, and reactive to light. Conjunctivae and EOM are normal.  Neck: Normal range of motion and full passive range of motion without pain. Neck supple. No JVD present. Carotid bruit is not present. No thyromegaly present.  Cardiovascular: Normal rate, regular rhythm, normal heart sounds and intact distal pulses. Exam reveals no gallop and no friction rub.  No murmur heard. Pulmonary/Chest: Effort normal and breath sounds normal. She has no wheezes. She has no rales. Right breast exhibits no inverted nipple, no mass, no nipple discharge, no skin change and no tenderness. Left breast exhibits no inverted nipple, no mass, no nipple discharge, no skin change and no tenderness. No breast swelling, tenderness or discharge. Breasts are symmetrical.  Abdominal: Soft. Bowel sounds are normal. She exhibits no distension and no mass. There is no tenderness. There is no rebound and no guarding.  Musculoskeletal:       Right shoulder: Normal.       Left shoulder: Normal.       Cervical back: Normal.  Lymphadenopathy:    She has no cervical adenopathy.  Neurological: She is alert and oriented to person, place, and time. She has normal reflexes. No cranial nerve deficit. She exhibits normal muscle tone. Coordination normal.  Skin: Skin is warm and dry. No rash noted. She is not diaphoretic. No erythema. No pallor.  Psychiatric: She has a normal mood and affect. Her behavior is normal. Judgment and thought content normal.  Nursing note and vitals reviewed.  No results found. Depression screen [facility] 2/9 06/12/2017 12/05/2016 05/08/2016 11/08/2015 11/29/2014  Decreased Interest 0 0 0 0 0  Down, Depressed, Hopeless 0 0 0 0 0  PHQ - 2 Score  0 0 0 0 0   Fall Risk  06/12/2017 12/05/2016 05/08/2016 11/08/2015 11/29/2014  Falls in the past year? No Yes Yes No Yes  Comment - - - - -  Number falls in past yr: - 2 or more 2 or more - 2 or more  Injury with Fall? - No No - No  Comment - - - - -        Assessment & Plan:   1. Routine physical examination   2. Seasonal allergic rhinitis due to pollen   3. Fibromyalgia   4. Depression with anxiety   5. Chronic pain syndrome   6. Pure hypercholesterolemia   7. Glucose intolerance   8. Screening for breast cancer   9. Venous stasis   10. Screening for cardiovascular condition   11. Tobacco abuse   12. Psychophysiological insomnia   13. Other abnormal glucose   14. Urinary retention   15. Lumbar facet arthropathy   16. Family history of colonic polyps     -anticipatory guidance provided --- exercise, weight loss, safe driving practices, calcium 600mg  twice daily. -obtain age appropriate screening labs and labs for chronic disease management. -Tobacco abuse: Smoking cessation instruction/counseling given:  counseled patient on the dangers of tobacco use, advised patient to stop smoking, and reviewed strategies to maximize success. -anxiety and depression with insomnia: continue Prozac; add Buspar.    Orders Placed This Encounter  Procedures  . MM Digital Diagnostic Bilat    Standing Status:   Future    Standing Expiration Date:   08/13/2018    Order Specific Question:   Reason for Exam (SYMPTOM  OR DIAGNOSIS REQUIRED)    Answer:   annual screening    Order Specific Question:   Is the patient pregnant?    Answer:   No    Order Specific Question:   Preferred imaging location?    Answer:   [facility]  . CBC with Differential/Platelet  . Comprehensive metabolic panel    Order Specific Question:   Has the patient fasted?    Answer:   No  . Hemoglobin A1c  . Lipid panel    Order Specific Question:   Has the patient fasted?    Answer:   No  . TSH  . POCT urinalysis  dipstick  . EKG 12-Lead   Meds ordered this encounter  Medications  . busPIRone (BUSPAR) 5 MG tablet    Sig: Take 1 tablet (5 mg total) by mouth 3 (three) times daily.    Dispense:  90 tablet    Refill:  5  . simvastatin (ZOCOR) 40 MG tablet    Sig: TAKE 1 TABLET (40 MG TOTAL) BY MOUTH AT BEDTIME.    Dispense:  90 tablet    Refill:  1  . potassium chloride (KLOR-CON) 20 MEQ packet    Sig: Take 20 mEq by mouth daily.    Dispense:  90 tablet    Refill:  3  . furosemide (LASIX) 20 MG tablet    Sig: Take 1 tablet (20 mg total) by mouth daily.    Dispense:  90 tablet    Refill:  1  . fluticasone (FLONASE) 50 MCG/ACT nasal spray    Sig: Place 2 sprays into both nostrils daily.    Dispense:  48 g    Refill:  3  . FLUoxetine (PROZAC) 20 MG tablet    Sig: Take 2-3 tablets (40-60 mg total) by mouth daily.    Dispense:  270 tablet    Refill:  1    Return in about 4 months (around 10/12/2017) for recheck anxiety.   Lain Tetterton Elayne Guerin, M.D. Primary Care at [facility] previously [facility]  [facility] [address] [place], [place]  [address] [phone] phone [phone] fax

## 2017-06-13 LAB — LIPID PANEL
CHOLESTEROL TOTAL: 149 mg/dL (ref 100–199)
Chol/HDL Ratio: 2.8 ratio (ref 0.0–4.4)
HDL: 54 mg/dL (ref >39)
LDL Calculated: 56 mg/dL (ref 0–99)
Triglycerides: 195 mg/dL — ABNORMAL HIGH (ref 0–149)
VLDL Cholesterol Cal: 39 mg/dL (ref 5–40)

## 2017-06-13 LAB — COMPREHENSIVE METABOLIC PANEL
ALK PHOS: 85 IU/L (ref 39–117)
ALT: 12 IU/L (ref 0–32)
AST: 18 IU/L (ref 0–40)
Albumin/Globulin Ratio: 2 (ref 1.2–2.2)
Albumin: 4.2 g/dL (ref 3.5–5.5)
BUN/Creatinine Ratio: 16 (ref 9–23)
BUN: 12 mg/dL (ref 6–24)
Bilirubin Total: <0.2 mg/dL (ref 0.0–1.2)
CO2: 26 mmol/L (ref 20–29)
CREATININE: 0.75 mg/dL (ref 0.57–1.00)
Calcium: 9.7 mg/dL (ref 8.7–10.2)
Chloride: 101 mmol/L (ref 96–106)
GFR calc Af Amer: 104 mL/min/{1.73_m2} (ref >59)
GFR calc non Af Amer: 90 mL/min/{1.73_m2} (ref >59)
GLUCOSE: 65 mg/dL (ref 65–99)
Globulin, Total: 2.1 g/dL (ref 1.5–4.5)
Potassium: 4.1 mmol/L (ref 3.5–5.2)
SODIUM: 141 mmol/L (ref 134–144)
Total Protein: 6.3 g/dL (ref 6.0–8.5)

## 2017-06-13 LAB — HEMOGLOBIN A1C
ESTIMATED AVERAGE GLUCOSE: 117 mg/dL
HEMOGLOBIN A1C: 5.7 % — AB (ref 4.8–5.6)

## 2017-06-13 LAB — CBC WITH DIFFERENTIAL/PLATELET
BASOS ABS: 0 10*3/uL (ref 0.0–0.2)
Basos: 0 %
EOS (ABSOLUTE): 0.2 10*3/uL (ref 0.0–0.4)
Eos: 2 %
Hematocrit: 35.8 % (ref 34.0–46.6)
Hemoglobin: 11.7 g/dL (ref 11.1–15.9)
IMMATURE GRANULOCYTES: 0 %
Immature Grans (Abs): 0 10*3/uL (ref 0.0–0.1)
LYMPHS ABS: 3 10*3/uL (ref 0.7–3.1)
Lymphs: 49 %
MCH: 29.7 pg (ref 26.6–33.0)
MCHC: 32.7 g/dL (ref 31.5–35.7)
MCV: 91 fL (ref 79–97)
MONOS ABS: 0.5 10*3/uL (ref 0.1–0.9)
Monocytes: 8 %
NEUTROS PCT: 41 %
Neutrophils Absolute: 2.5 10*3/uL (ref 1.4–7.0)
PLATELETS: 260 10*3/uL (ref 150–450)
RBC: 3.94 x10E6/uL (ref 3.77–5.28)
RDW: 14.1 % (ref 12.3–15.4)
WBC: 6.2 10*3/uL (ref 3.4–10.8)

## 2017-06-13 LAB — TSH: TSH: 1.36 u[IU]/mL (ref 0.450–4.500)

## 2017-09-25 ENCOUNTER — Other Ambulatory Visit: Payer: Self-pay

## 2017-09-25 ENCOUNTER — Ambulatory Visit
Admission: EM | Admit: 2017-09-25 | Discharge: 2017-09-25 | Disposition: A | Discharge status: Home / Self Care | Payer: Commercial Managed Care - PPO | Attending: Family Medicine | Admitting: Family Medicine

## 2017-09-25 DIAGNOSIS — R062 Wheezing: Secondary | ICD-10-CM

## 2017-09-25 DIAGNOSIS — R059 Cough, unspecified: Secondary | ICD-10-CM

## 2017-09-25 DIAGNOSIS — R05 Cough: Secondary | ICD-10-CM | POA: Diagnosis not present

## 2017-09-25 MED ORDER — ALBUTEROL SULFATE HFA 108 (90 BASE) MCG/ACT IN AERS: 1.0000 | INHALATION_SPRAY | Freq: Four times a day (QID) | RESPIRATORY_TRACT | 0 refills | Status: AC | PRN

## 2017-09-25 MED ORDER — DOXYCYCLINE HYCLATE 100 MG PO TABS: 100.0000 mg | ORAL_TABLET | Freq: Two times a day (BID) | ORAL | 0 refills | Status: AC

## 2017-09-25 MED ORDER — PREDNISONE 20 MG PO TABS: ORAL_TABLET | ORAL | 0 refills | Status: AC

## 2017-09-25 NOTE — ED Triage Notes (Addendum)
Pt started with head congestion on Sunday. Then started with drainage down throat and then cough with wheezing.. Non-productive cough, and eyes watering. Pt smokes a pack a day

## 2017-09-25 NOTE — ED Provider Notes (Signed)
[facility]    CSN: [id number] Arrival date & time: 09/25/17  1218     History   Chief Complaint Chief Complaint  Patient presents with  . Cough    HPI Christie Benjamin is a 57 y.o. female.   The history is provided by the patient.  Cough  Associated symptoms: rhinorrhea and wheezing   URI  Presenting symptoms: congestion, cough and rhinorrhea   Severity:  Moderate Onset quality:  Sudden Duration:  5 days Timing:  Constant Progression:  Worsening Chronicity:  New Relieved by:  Nothing Ineffective treatments:  OTC medications Associated symptoms: wheezing   Risk factors: sick contacts   Risk factors: not elderly, no chronic cardiac disease, no chronic kidney disease, no diabetes mellitus, no immunosuppression, no recent illness and no recent travel  Chronic respiratory disease: chronic smoker.     Past Medical History:  Diagnosis Date  . Allergic rhinitis, cause unspecified   . Anxiety   . Depressive disorder, not elsewhere classified   . Disturbance of skin sensation   . Diverticulosis of colon (without mention of hemorrhage)   . Fibromyalgia 06/08/2013   rheumatology consultation [facility].  Marland Kitchen GERD (gastroesophageal reflux disease)   . Internal hemorrhoids without mention of complication   . Myalgia and myositis, unspecified   . Neuralgia, neuritis, and radiculitis, unspecified   . Neurogenic bladder, NOS   . Other abnormal glucose   . Pain in joint, shoulder region   . Personal history of alcoholism ([place])   . Pure hypercholesterolemia   . Seizures ([place])   . Tobacco use disorder   . Unspecified venous (peripheral) insufficiency     Patient Active Problem List   Diagnosis Date Noted  . Family history of colonic polyps   . Benign neoplasm of transverse colon   . Spondylosis of lumbar region without myelopathy or radiculopathy 03/19/2016  . Lumbar facet arthropathy 12/28/2014  . Fibromyalgia 07/26/2014  . BMI 27.0-27.9,adult 12/01/2013  .  Tobacco abuse 12/01/2013  . Chronic pain syndrome 10/27/2013  . Long term use of drug 10/27/2013  . Pain medication agreement signed 10/27/2013  . Insomnia 05/27/2013  . Abnormality of gait 12/01/2012  . Incomplete emptying of bladder 01/10/2012  . Depression with anxiety 10/29/2011  . Allergic rhinitis 10/29/2011  . Pure hypercholesterolemia 10/29/2011  . Other abnormal glucose 10/29/2011  . Muscle weakness 10/29/2011  . Urinary retention 10/29/2011  . Shoulder joint pain 07/27/2011  . 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 Mechanical complication of other vascular device, implant, and graft 01/02/2009  . Neurogenic bladder 05/13/2007    Past Surgical History:  Procedure Laterality Date  . BREAST ENHANCEMENT SURGERY    . COLONOSCOPY  11/04/2008   IH: repeat in 5 years.  . COLONOSCOPY WITH PROPOFOL N/A 07/16/2016   Procedure: COLONOSCOPY WITH PROPOFOL;  Surgeon: Lucilla Lame, MD;  Location: [place];  Service: Endoscopy;  Laterality: N/A;  . INTERSTIM IMPLANT PLACEMENT    . POLYPECTOMY  07/16/2016   Procedure: POLYPECTOMY;  Surgeon: Lucilla Lame, MD;  Location: [place];  Service: Endoscopy;;  . TEMPOROMANDIBULAR JOINT SURGERY    . VAGINAL HYSTERECTOMY  1996   endometriosis;  ovaries removed.    OB History   None      Home Medications    Prior to Admission medications   Medication Sig Start Date End Date Taking? Authorizing Provider  albuterol (PROVENTIL HFA;VENTOLIN HFA) 108 (90 Base) MCG/ACT inhaler Inhale 1-2 puffs into the lungs every 6 (six)  hours as needed for wheezing or shortness of breath. 09/25/17   Norval Gable, MD  AMITIZA 24 MCG capsule TAKE 1 CAPSULE (24 MCG TOTAL) BY MOUTH 2 (TWO) TIMES DAILY WITH A MEAL. 05/02/17   Lucilla Lame, MD  aspirin 81 MG tablet Take 81 mg by mouth daily.    [provider]  busPIRone (BUSPAR) 5 MG tablet Take 1 tablet (5 mg total) by mouth 3 (three) times daily. 06/12/17   Wardell Honour, MD   cholecalciferol (VITAMIN D) 1000 UNITS tablet Take 1,000 Units by mouth daily.    [provider]  doxycycline (VIBRA-TABS) 100 MG tablet Take 1 tablet (100 mg total) by mouth 2 (two) times daily. 09/25/17   Norval Gable, MD  FLUoxetine (PROZAC) 20 MG tablet Take 2-3 tablets (40-60 mg total) by mouth daily. 06/12/17   Wardell Honour, MD  fluticasone Asencion Islam) 50 MCG/ACT nasal spray Place 2 sprays into both nostrils daily. 06/12/17   Wardell Honour, MD  furosemide (LASIX) 20 MG tablet Take 1 tablet (20 mg total) by mouth daily. 06/12/17   Wardell Honour, MD  HYDROcodone-acetaminophen (NORCO/VICODIN) 5-325 MG per tablet TAKE 1 TABLET BY MOUTH TWICE A DAY 10/09/13   Marcial Pacas, MD  LYRICA 150 MG capsule TAKE ONE CAPSULE BY MOUTH 4 TIMES A DAY Patient taking differently: TAKE ONE CAPSULE BY MOUTH 2 TIMES A DAY 10/06/13   Marcial Pacas, MD  morphine (MS CONTIN) 15 MG 12 hr tablet Take 15 mg by mouth every 12 (twelve) hours.    [provider]  MOVANTIK 25 MG TABS tablet Take 25 mg by mouth daily. 06/13/16   [provider]  omeprazole (PRILOSEC) 20 MG capsule Take 20 mg by mouth 2 (two) times daily.    [provider]  potassium chloride (KLOR-CON) 20 MEQ packet Take 20 mEq by mouth daily. 06/12/17   Wardell Honour, MD  predniSONE (DELTASONE) 20 MG tablet 3 tabs po qd x 2 days, then 2 tabs po qd x 2 days, then 1 tab po qd x 2 days, then half a tab po qd x 2 days 09/25/17   Norval Gable, MD  simvastatin (ZOCOR) 40 MG tablet TAKE 1 TABLET (40 MG TOTAL) BY MOUTH AT BEDTIME. 06/12/17   Wardell Honour, MD    Family History Family History  Problem Relation Age of Onset  . Asthma Mother   . COPD Mother   . Hyperlipidemia Father   . Parkinsonism Father   . Cancer Sister        skin, fingers, spread to bone  . Stroke Unknown     Social History Social History   Tobacco Use  . Smoking status: Current Every Day Smoker    Packs/day: 1.00    Years: 30.00    Pack years:  30.00    Types: Cigarettes  . Smokeless tobacco: Never Used  Substance Use Topics  . Alcohol use: No    Comment: recovering alcoholic [id number]  . Drug use: No     Allergies   Sulfa antibiotics; Septra ds [sulfamethoxazole-trimethoprim]; and Tape   Review of Systems Review of Systems  HENT: Positive for congestion and rhinorrhea.   Respiratory: Positive for cough and wheezing.      Physical Exam Triage Vital Signs ED Triage Vitals  Enc Vitals Group     BP 09/25/17 1228 128/79     Pulse Rate 09/25/17 1228 66     Resp 09/25/17 1228 20  Temp 09/25/17 1228 98.3 F (36.8 C)     Temp Source 09/25/17 1228 Oral     SpO2 09/25/17 1228 98 %     Weight 09/25/17 1230 177 lb (80.3 kg)     Height 09/25/17 1230 5\' 7"  (1.702 m)     Head Circumference --      Peak Flow --      Pain Score 09/25/17 1230 0     Pain Loc --      Pain Edu? --      Excl. in [place]? --    No data found.  Updated Vital Signs BP 128/79 (BP Location: Right Arm)   Pulse 66   Temp 98.3 F (36.8 C) (Oral)   Resp 20   Ht 5\' 7"  (1.702 m)   Wt 80.3 kg   SpO2 98%   BMI 27.72 kg/m   Visual Acuity Right Eye Distance:   Left Eye Distance:   Bilateral Distance:    Right Eye Near:   Left Eye Near:    Bilateral Near:     Physical Exam  Constitutional: She appears well-developed and well-nourished. No distress.  HENT:  Head: Normocephalic and atraumatic.  Right Ear: Tympanic membrane, external ear and ear canal normal.  Left Ear: Tympanic membrane, external ear and ear canal normal.  Nose: Mucosal edema and rhinorrhea present. No nose lacerations, sinus tenderness, nasal deformity, septal deviation or nasal septal hematoma. No epistaxis.  No foreign bodies. Right sinus exhibits no maxillary sinus tenderness and no frontal sinus tenderness. Left sinus exhibits no maxillary sinus tenderness and no frontal sinus tenderness.  Mouth/Throat: Uvula is midline, oropharynx is clear and moist and mucous membranes are  normal. No oropharyngeal exudate.  Eyes: Conjunctivae are normal. Right eye exhibits no discharge. Left eye exhibits no discharge. No scleral icterus.  Neck: Normal range of motion. Neck supple. No thyromegaly present.  Cardiovascular: Normal rate, regular rhythm and normal heart sounds.  Pulmonary/Chest: Effort normal. No stridor. No respiratory distress. She has wheezes (diffusely and diffuse rhonchi). She has no rales.  Lymphadenopathy:    She has no cervical adenopathy.  Skin: She is not diaphoretic.  Nursing note and vitals reviewed.    UC Treatments / Results  Labs (all labs ordered are listed, but only abnormal results are displayed) Labs Reviewed - No data to display  EKG None  Radiology No results found.  Procedures Procedures (including critical care time)  Medications Ordered in UC Medications - No data to display  Initial Impression / Assessment and Plan / UC Course  I have reviewed the triage vital signs and the nursing notes.  Pertinent labs & imaging results that were available during my care of the patient were reviewed by me and considered in my medical decision making (see chart for details).      Final Clinical Impressions(s) / UC Diagnoses   Final diagnoses:  Cough  Wheezing    ED Prescriptions    Medication Sig Dispense Auth. Provider   doxycycline (VIBRA-TABS) 100 MG tablet Take 1 tablet (100 mg total) by mouth 2 (two) times daily. 20 tablet Norval Gable, MD   albuterol (PROVENTIL HFA;VENTOLIN HFA) 108 (90 Base) MCG/ACT inhaler Inhale 1-2 puffs into the lungs every 6 (six) hours as needed for wheezing or shortness of breath. [place], MD   predniSONE (DELTASONE) 20 MG tablet 3 tabs po qd x 2 days, then 2 tabs po qd x 2 days, then 1 tab po qd x 2 days,  then half a tab po qd x 2 days 13 tablet Norval Gable, MD     1. diagnosis reviewed with patient 2. rx as per orders above; reviewed possible side effects, interactions, risks  and benefits  3. Recommend supportive treatment with smoking cessation 4. Follow-up prn if symptoms worsen or don't improve    Controlled Substance Prescriptions [place] Controlled Substance Registry consulted? Not Applicable   Norval Gable, MD 09/25/17 [phone]

## 2017-11-25 ENCOUNTER — Other Ambulatory Visit: Payer: Self-pay | Admitting: Gastroenterology

## 2017-11-25 DIAGNOSIS — K5901 Slow transit constipation: Secondary | ICD-10-CM

## 2018-08-12 ENCOUNTER — Other Ambulatory Visit: Payer: Self-pay | Admitting: Family Medicine

## 2018-08-12 DIAGNOSIS — Z1231 Encounter for screening mammogram for malignant neoplasm of breast: Secondary | ICD-10-CM

## 2018-08-14 ENCOUNTER — Other Ambulatory Visit: Payer: Self-pay | Admitting: Family Medicine

## 2018-08-14 DIAGNOSIS — Z0000 Encounter for general adult medical examination without abnormal findings: Secondary | ICD-10-CM

## 2018-08-14 DIAGNOSIS — R1901 Right upper quadrant abdominal swelling, mass and lump: Secondary | ICD-10-CM

## 2018-08-21 ENCOUNTER — Ambulatory Visit
Admission: RE | Admit: 2018-08-21 | Discharge: 2018-08-21 | Disposition: A | Discharge status: Home / Self Care | Payer: Commercial Managed Care - PPO | Source: Ambulatory Visit | Attending: Family Medicine | Admitting: Family Medicine

## 2018-08-21 ENCOUNTER — Other Ambulatory Visit: Payer: Self-pay

## 2018-08-21 DIAGNOSIS — Z0000 Encounter for general adult medical examination without abnormal findings: Secondary | ICD-10-CM | POA: Diagnosis present

## 2018-08-21 DIAGNOSIS — R1901 Right upper quadrant abdominal swelling, mass and lump: Secondary | ICD-10-CM | POA: Diagnosis present
